# Patient Record
Sex: Female | Born: 1945
Health system: Southern US, Community
[De-identification: ages and names within clinical notes are randomized; demographics above are authoritative.]

## PROBLEM LIST (undated history)

## (undated) DIAGNOSIS — C801 Malignant (primary) neoplasm, unspecified: Secondary | ICD-10-CM

## (undated) DIAGNOSIS — I252 Old myocardial infarction: Secondary | ICD-10-CM

## (undated) DIAGNOSIS — E039 Hypothyroidism, unspecified: Secondary | ICD-10-CM

## (undated) DIAGNOSIS — I519 Heart disease, unspecified: Secondary | ICD-10-CM

## (undated) DIAGNOSIS — M419 Scoliosis, unspecified: Secondary | ICD-10-CM

## (undated) DIAGNOSIS — F329 Major depressive disorder, single episode, unspecified: Secondary | ICD-10-CM

## (undated) DIAGNOSIS — M1990 Unspecified osteoarthritis, unspecified site: Secondary | ICD-10-CM

## (undated) DIAGNOSIS — D510 Vitamin B12 deficiency anemia due to intrinsic factor deficiency: Secondary | ICD-10-CM

## (undated) DIAGNOSIS — K5900 Constipation, unspecified: Secondary | ICD-10-CM

## (undated) DIAGNOSIS — K219 Gastro-esophageal reflux disease without esophagitis: Secondary | ICD-10-CM

## (undated) DIAGNOSIS — K259 Gastric ulcer, unspecified as acute or chronic, without hemorrhage or perforation: Secondary | ICD-10-CM

## (undated) DIAGNOSIS — J45998 Other asthma: Secondary | ICD-10-CM

## (undated) DIAGNOSIS — I351 Nonrheumatic aortic (valve) insufficiency: Secondary | ICD-10-CM

## (undated) DIAGNOSIS — F32A Depression, unspecified: Secondary | ICD-10-CM

## (undated) DIAGNOSIS — R1010 Upper abdominal pain, unspecified: Secondary | ICD-10-CM

## (undated) DIAGNOSIS — E785 Hyperlipidemia, unspecified: Secondary | ICD-10-CM

## (undated) DIAGNOSIS — I10 Essential (primary) hypertension: Secondary | ICD-10-CM

## (undated) HISTORY — DX: Heart disease, unspecified: I51.9

## (undated) HISTORY — DX: Scoliosis, unspecified: M41.9

## (undated) HISTORY — DX: Vitamin B12 deficiency anemia due to intrinsic factor deficiency: D51.0

## (undated) HISTORY — DX: Gastro-esophageal reflux disease without esophagitis: K21.9

## (undated) HISTORY — PX: HYSTERECTOMY ABDOMINAL WITH SALPINGECTOMY: SHX6725

## (undated) HISTORY — DX: Major depressive disorder, single episode, unspecified: F32.9

## (undated) HISTORY — DX: Essential (primary) hypertension: I10

## (undated) HISTORY — DX: Unspecified osteoarthritis, unspecified site: M19.90

## (undated) HISTORY — DX: Malignant (primary) neoplasm, unspecified: C80.1

## (undated) HISTORY — DX: Other asthma: J45.998

## (undated) HISTORY — DX: Gastric ulcer, unspecified as acute or chronic, without hemorrhage or perforation: K25.9

## (undated) HISTORY — DX: Hyperlipidemia, unspecified: E78.5

## (undated) HISTORY — PX: SKIN CANCER EXCISION: SHX779

## (undated) HISTORY — DX: Upper abdominal pain, unspecified: R10.10

## (undated) HISTORY — DX: Depression, unspecified: F32.A

## (undated) HISTORY — DX: Old myocardial infarction: I25.2

## (undated) HISTORY — DX: Hypothyroidism, unspecified: E03.9

## (undated) HISTORY — PX: OTHER SURGICAL HISTORY: SHX169

## (undated) HISTORY — DX: Nonrheumatic aortic (valve) insufficiency: I35.1

## (undated) HISTORY — DX: Constipation, unspecified: K59.00

---
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
## 1968-09-20 HISTORY — PX: INGUINAL HERNIA REPAIR: SUR1180

## 1972-09-20 HISTORY — PX: CHOLECYSTECTOMY: SHX55

## 1974-09-20 HISTORY — PX: TOTAL ABDOMINAL HYSTERECTOMY: SHX209

## 2003-09-21 HISTORY — PX: OTHER SURGICAL HISTORY: SHX169

## 2007-08-14 ENCOUNTER — Ambulatory Visit: Payer: Self-pay | Admitting: Family Medicine

## 2007-08-14 ENCOUNTER — Encounter: Admission: RE | Admit: 2007-08-14 | Discharge: 2007-08-14 | Payer: Self-pay | Admitting: Family Medicine

## 2007-08-14 DIAGNOSIS — I10 Essential (primary) hypertension: Secondary | ICD-10-CM

## 2007-08-14 DIAGNOSIS — E785 Hyperlipidemia, unspecified: Secondary | ICD-10-CM

## 2007-08-14 DIAGNOSIS — E039 Hypothyroidism, unspecified: Secondary | ICD-10-CM | POA: Insufficient documentation

## 2007-08-14 LAB — CONVERTED CEMR LAB
Blood in Urine, dipstick: NEGATIVE
Glucose, Urine, Semiquant: NEGATIVE
Nitrite: NEGATIVE
Protein, U semiquant: NEGATIVE
Specific Gravity, Urine: 1.015

## 2007-08-15 ENCOUNTER — Encounter: Payer: Self-pay | Admitting: Family Medicine

## 2007-08-15 LAB — CONVERTED CEMR LAB
Basophils Absolute: 0 10*3/uL (ref 0.0–0.1)
Basophils Relative: 1 % (ref 0–1)
Eosinophils Absolute: 0.2 10*3/uL (ref 0.2–0.7)
MCHC: 32.3 g/dL (ref 30.0–36.0)
MCV: 91.7 fL (ref 78.0–100.0)
Monocytes Relative: 7 % (ref 3–12)
Neutro Abs: 3.9 10*3/uL (ref 1.7–7.7)
Neutrophils Relative %: 54 % (ref 43–77)
Platelets: 259 10*3/uL (ref 150–400)
RBC: 4.59 M/uL (ref 3.87–5.11)
Sed Rate: 9 mm/hr (ref 0–22)
TSH: 3.858 microintl units/mL (ref 0.350–5.50)

## 2007-08-22 ENCOUNTER — Encounter: Payer: Self-pay | Admitting: Family Medicine

## 2007-08-28 ENCOUNTER — Ambulatory Visit: Payer: Self-pay | Admitting: Family Medicine

## 2007-08-28 DIAGNOSIS — M546 Pain in thoracic spine: Secondary | ICD-10-CM

## 2007-08-28 DIAGNOSIS — K5900 Constipation, unspecified: Secondary | ICD-10-CM | POA: Insufficient documentation

## 2007-09-07 ENCOUNTER — Telehealth (INDEPENDENT_AMBULATORY_CARE_PROVIDER_SITE_OTHER): Payer: Self-pay | Admitting: *Deleted

## 2007-09-22 ENCOUNTER — Telehealth: Payer: Self-pay | Admitting: Family Medicine

## 2007-09-28 ENCOUNTER — Telehealth: Payer: Self-pay | Admitting: Family Medicine

## 2007-10-13 ENCOUNTER — Encounter: Payer: Self-pay | Admitting: Family Medicine

## 2007-10-16 ENCOUNTER — Encounter: Payer: Self-pay | Admitting: Family Medicine

## 2007-10-17 ENCOUNTER — Telehealth: Payer: Self-pay | Admitting: Family Medicine

## 2007-10-17 LAB — CONVERTED CEMR LAB: TSH: 0.065 microintl units/mL — ABNORMAL LOW (ref 0.350–5.50)

## 2007-10-18 ENCOUNTER — Encounter: Admission: RE | Admit: 2007-10-18 | Discharge: 2007-10-18 | Payer: Self-pay | Admitting: Family Medicine

## 2007-10-18 ENCOUNTER — Ambulatory Visit: Payer: Self-pay | Admitting: Family Medicine

## 2007-10-18 DIAGNOSIS — C649 Malignant neoplasm of unspecified kidney, except renal pelvis: Secondary | ICD-10-CM | POA: Insufficient documentation

## 2007-10-19 LAB — CONVERTED CEMR LAB
AST: 17 units/L (ref 0–37)
Albumin: 4.1 g/dL (ref 3.5–5.2)
Alkaline Phosphatase: 53 units/L (ref 39–117)
Basophils Absolute: 0 10*3/uL (ref 0.0–0.1)
Basophils Relative: 1 % (ref 0–1)
Eosinophils Absolute: 0.2 10*3/uL (ref 0.0–0.7)
Eosinophils Relative: 3 % (ref 0–5)
Glucose, Bld: 102 mg/dL — ABNORMAL HIGH (ref 70–99)
HCT: 40 % (ref 36.0–46.0)
Lymphs Abs: 2 10*3/uL (ref 0.7–4.0)
MCHC: 32.8 g/dL (ref 30.0–36.0)
MCV: 90.5 fL (ref 78.0–100.0)
Neutrophils Relative %: 55 % (ref 43–77)
Platelets: 251 10*3/uL (ref 150–400)
Potassium: 4.1 meq/L (ref 3.5–5.3)
RDW: 12.6 % (ref 11.5–15.5)
Sodium: 145 meq/L (ref 135–145)
Total Bilirubin: 0.7 mg/dL (ref 0.3–1.2)
Total Protein: 6.7 g/dL (ref 6.0–8.3)
WBC: 5.9 10*3/uL (ref 4.0–10.5)

## 2007-10-30 ENCOUNTER — Encounter: Admission: RE | Admit: 2007-10-30 | Discharge: 2007-10-30 | Payer: Self-pay | Admitting: Family Medicine

## 2007-11-01 ENCOUNTER — Telehealth: Payer: Self-pay | Admitting: Family Medicine

## 2007-11-02 ENCOUNTER — Encounter: Payer: Self-pay | Admitting: Family Medicine

## 2007-11-03 ENCOUNTER — Ambulatory Visit: Payer: Self-pay | Admitting: Family Medicine

## 2007-11-03 DIAGNOSIS — G44209 Tension-type headache, unspecified, not intractable: Secondary | ICD-10-CM

## 2007-11-30 ENCOUNTER — Telehealth: Payer: Self-pay | Admitting: Family Medicine

## 2008-01-25 ENCOUNTER — Encounter: Payer: Self-pay | Admitting: Family Medicine

## 2008-01-25 ENCOUNTER — Ambulatory Visit: Payer: Self-pay | Admitting: Family Medicine

## 2008-01-25 DIAGNOSIS — M2550 Pain in unspecified joint: Secondary | ICD-10-CM | POA: Insufficient documentation

## 2008-01-25 DIAGNOSIS — F329 Major depressive disorder, single episode, unspecified: Secondary | ICD-10-CM | POA: Insufficient documentation

## 2008-01-25 DIAGNOSIS — D510 Vitamin B12 deficiency anemia due to intrinsic factor deficiency: Secondary | ICD-10-CM

## 2008-01-25 DIAGNOSIS — R290 Tetany: Secondary | ICD-10-CM | POA: Insufficient documentation

## 2008-01-26 LAB — CONVERTED CEMR LAB
BUN: 25 mg/dL — ABNORMAL HIGH (ref 6–23)
CO2: 26 meq/L (ref 19–32)
Calcium: 9.6 mg/dL (ref 8.4–10.5)
Glucose, Bld: 99 mg/dL (ref 70–99)
Sodium: 142 meq/L (ref 135–145)

## 2008-02-02 ENCOUNTER — Telehealth: Payer: Self-pay | Admitting: Family Medicine

## 2008-02-05 ENCOUNTER — Encounter: Payer: Self-pay | Admitting: Family Medicine

## 2008-02-05 DIAGNOSIS — R609 Edema, unspecified: Secondary | ICD-10-CM | POA: Insufficient documentation

## 2008-02-06 ENCOUNTER — Encounter: Payer: Self-pay | Admitting: Family Medicine

## 2008-02-15 ENCOUNTER — Ambulatory Visit: Payer: Self-pay | Admitting: Family Medicine

## 2008-02-15 DIAGNOSIS — M76899 Other specified enthesopathies of unspecified lower limb, excluding foot: Secondary | ICD-10-CM

## 2008-02-19 ENCOUNTER — Telehealth: Payer: Self-pay | Admitting: Family Medicine

## 2008-02-20 ENCOUNTER — Telehealth (INDEPENDENT_AMBULATORY_CARE_PROVIDER_SITE_OTHER): Payer: Self-pay | Admitting: *Deleted

## 2008-02-21 ENCOUNTER — Encounter: Payer: Self-pay | Admitting: Family Medicine

## 2008-02-22 LAB — CONVERTED CEMR LAB: Sed Rate: 17 mm/hr (ref 0–22)

## 2008-02-23 ENCOUNTER — Telehealth: Payer: Self-pay | Admitting: Family Medicine

## 2008-02-23 LAB — CONVERTED CEMR LAB: Rhuematoid fact SerPl-aCnc: <20 intl units/mL (ref 0–20)

## 2008-03-27 ENCOUNTER — Ambulatory Visit: Payer: Self-pay | Admitting: Family Medicine

## 2008-03-27 LAB — CONVERTED CEMR LAB
ALT: 19 units/L (ref 0–35)
Albumin: 4.1 g/dL (ref 3.5–5.2)
Alkaline Phosphatase: 54 units/L (ref 39–117)
Ferritin: 28 ng/mL (ref 10–291)
Folate: >20 ng/mL
Hemoglobin: 12.4 g/dL (ref 12.0–15.0)
LDL Cholesterol: 80 mg/dL (ref 0–99)
Potassium: 4 meq/L (ref 3.5–5.3)
Sodium: 142 meq/L (ref 135–145)
Total Bilirubin: 0.8 mg/dL (ref 0.3–1.2)
Total Protein: 6.6 g/dL (ref 6.0–8.3)
VLDL: 16 mg/dL (ref 0–40)

## 2008-03-28 ENCOUNTER — Encounter: Payer: Self-pay | Admitting: Family Medicine

## 2008-04-04 ENCOUNTER — Telehealth: Payer: Self-pay | Admitting: Family Medicine

## 2008-04-11 ENCOUNTER — Telehealth: Payer: Self-pay | Admitting: Family Medicine

## 2008-05-01 ENCOUNTER — Encounter: Payer: Self-pay | Admitting: Family Medicine

## 2008-05-01 ENCOUNTER — Telehealth: Payer: Self-pay | Admitting: Family Medicine

## 2008-05-01 ENCOUNTER — Ambulatory Visit: Payer: Self-pay | Admitting: Obstetrics & Gynecology

## 2008-05-22 ENCOUNTER — Ambulatory Visit: Payer: Self-pay | Admitting: Family Medicine

## 2008-05-23 ENCOUNTER — Encounter: Payer: Self-pay | Admitting: Family Medicine

## 2008-05-24 LAB — CONVERTED CEMR LAB
Albumin: 4.2 g/dL (ref 3.5–5.2)
Alkaline Phosphatase: 55 units/L (ref 39–117)
CO2: 26 meq/L (ref 19–32)
Calcium: 9.3 mg/dL (ref 8.4–10.5)
Chloride: 106 meq/L (ref 96–112)
Eosinophils Absolute: 0.2 10*3/uL (ref 0.0–0.7)
Glucose, Bld: 84 mg/dL (ref 70–99)
Lymphocytes Relative: 28 % (ref 12–46)
Lymphs Abs: 2.2 10*3/uL (ref 0.7–4.0)
Neutro Abs: 4.9 10*3/uL (ref 1.7–7.7)
Neutrophils Relative %: 62 % (ref 43–77)
Platelets: 223 10*3/uL (ref 150–400)
Potassium: 4.5 meq/L (ref 3.5–5.3)
Sodium: 143 meq/L (ref 135–145)
Total Protein: 6.8 g/dL (ref 6.0–8.3)
Vitamin B-12: 614 pg/mL (ref 211–911)
WBC: 7.9 10*3/uL (ref 4.0–10.5)

## 2008-05-28 ENCOUNTER — Telehealth (INDEPENDENT_AMBULATORY_CARE_PROVIDER_SITE_OTHER): Payer: Self-pay | Admitting: *Deleted

## 2008-06-12 ENCOUNTER — Encounter: Payer: Self-pay | Admitting: Family Medicine

## 2008-06-19 ENCOUNTER — Ambulatory Visit: Payer: Self-pay | Admitting: Family Medicine

## 2008-06-19 DIAGNOSIS — G43009 Migraine without aura, not intractable, without status migrainosus: Secondary | ICD-10-CM | POA: Insufficient documentation

## 2008-06-20 ENCOUNTER — Telehealth (INDEPENDENT_AMBULATORY_CARE_PROVIDER_SITE_OTHER): Payer: Self-pay | Admitting: *Deleted

## 2008-06-20 LAB — CONVERTED CEMR LAB
BUN: 25 mg/dL — ABNORMAL HIGH (ref 6–23)
Glucose, Bld: 83 mg/dL (ref 70–99)
Potassium: 3.6 meq/L (ref 3.5–5.3)

## 2008-06-24 ENCOUNTER — Telehealth: Payer: Self-pay | Admitting: Family Medicine

## 2008-06-28 ENCOUNTER — Ambulatory Visit: Payer: Self-pay | Admitting: Family Medicine

## 2008-06-28 LAB — CONVERTED CEMR LAB
Glucose, Urine, Semiquant: NEGATIVE
Protein, U semiquant: NEGATIVE
Urobilinogen, UA: NEGATIVE
WBC Urine, dipstick: NEGATIVE
pH: 6

## 2008-07-11 ENCOUNTER — Encounter: Payer: Self-pay | Admitting: Family Medicine

## 2008-07-11 ENCOUNTER — Ambulatory Visit: Admission: RE | Admit: 2008-07-11 | Discharge: 2008-07-11 | Payer: Self-pay | Admitting: Family Medicine

## 2008-07-15 ENCOUNTER — Telehealth: Payer: Self-pay | Admitting: Family Medicine

## 2008-07-15 DIAGNOSIS — I38 Endocarditis, valve unspecified: Secondary | ICD-10-CM | POA: Insufficient documentation

## 2008-07-17 ENCOUNTER — Encounter: Admission: RE | Admit: 2008-07-17 | Discharge: 2008-07-17 | Payer: Self-pay | Admitting: Cardiology

## 2008-07-17 ENCOUNTER — Ambulatory Visit: Payer: Self-pay | Admitting: Cardiology

## 2008-08-09 ENCOUNTER — Ambulatory Visit: Payer: Self-pay | Admitting: Family Medicine

## 2008-08-09 DIAGNOSIS — E559 Vitamin D deficiency, unspecified: Secondary | ICD-10-CM | POA: Insufficient documentation

## 2008-08-13 LAB — CONVERTED CEMR LAB
BUN: 24 mg/dL — ABNORMAL HIGH
CO2: 24 meq/L
Calcium: 9.2 mg/dL
Chloride: 109 meq/L
Creatinine, Ser: 1.12 mg/dL
Glucose, Bld: 111 mg/dL — ABNORMAL HIGH
Potassium: 4 meq/L
Sodium: 143 meq/L
Vit D, 1,25-Dihydroxy: 41

## 2008-08-14 ENCOUNTER — Encounter: Payer: Self-pay | Admitting: Family Medicine

## 2008-08-21 ENCOUNTER — Ambulatory Visit: Payer: Self-pay | Admitting: Family Medicine

## 2008-08-21 ENCOUNTER — Encounter: Payer: Self-pay | Admitting: Emergency Medicine

## 2008-08-21 ENCOUNTER — Ambulatory Visit: Payer: Self-pay | Admitting: Diagnostic Radiology

## 2008-08-21 ENCOUNTER — Ambulatory Visit: Payer: Self-pay | Admitting: Internal Medicine

## 2008-08-21 ENCOUNTER — Inpatient Hospital Stay (HOSPITAL_COMMUNITY): Admission: AD | Admit: 2008-08-21 | Discharge: 2008-08-22 | Payer: Self-pay | Admitting: Internal Medicine

## 2008-08-26 ENCOUNTER — Ambulatory Visit: Payer: Self-pay

## 2008-08-26 ENCOUNTER — Encounter: Payer: Self-pay | Admitting: Family Medicine

## 2008-09-05 ENCOUNTER — Ambulatory Visit: Payer: Self-pay | Admitting: Cardiology

## 2008-09-05 ENCOUNTER — Telehealth: Payer: Self-pay | Admitting: Family Medicine

## 2008-09-19 ENCOUNTER — Ambulatory Visit: Payer: Self-pay | Admitting: Family Medicine

## 2008-09-26 ENCOUNTER — Encounter: Payer: Self-pay | Admitting: Family Medicine

## 2008-10-11 ENCOUNTER — Ambulatory Visit: Payer: Self-pay | Admitting: Family Medicine

## 2008-11-04 ENCOUNTER — Encounter: Admission: RE | Admit: 2008-11-04 | Discharge: 2008-11-04 | Payer: Self-pay | Admitting: Family Medicine

## 2008-11-13 ENCOUNTER — Telehealth: Payer: Self-pay | Admitting: Family Medicine

## 2008-11-15 ENCOUNTER — Encounter: Admission: RE | Admit: 2008-11-15 | Discharge: 2008-11-15 | Payer: Self-pay | Admitting: Family Medicine

## 2008-11-15 ENCOUNTER — Ambulatory Visit: Payer: Self-pay | Admitting: Family Medicine

## 2008-12-26 ENCOUNTER — Telehealth: Payer: Self-pay | Admitting: Family Medicine

## 2008-12-26 ENCOUNTER — Encounter: Payer: Self-pay | Admitting: Family Medicine

## 2009-01-08 ENCOUNTER — Telehealth: Payer: Self-pay | Admitting: Family Medicine

## 2009-01-10 ENCOUNTER — Encounter: Payer: Self-pay | Admitting: Family Medicine

## 2009-01-14 LAB — CONVERTED CEMR LAB
Basophils Relative: 1 % (ref 0–1)
Eosinophils Absolute: 0.2 10*3/uL (ref 0.0–0.7)
MCHC: 33.3 g/dL (ref 30.0–36.0)
MCV: 93.4 fL (ref 78.0–100.0)
Neutrophils Relative %: 61 % (ref 43–77)
Platelets: 189 10*3/uL (ref 150–400)

## 2009-01-20 ENCOUNTER — Ambulatory Visit: Payer: Self-pay | Admitting: Family Medicine

## 2009-01-22 ENCOUNTER — Telehealth (INDEPENDENT_AMBULATORY_CARE_PROVIDER_SITE_OTHER): Payer: Self-pay | Admitting: *Deleted

## 2009-02-19 ENCOUNTER — Ambulatory Visit: Payer: Self-pay | Admitting: Cardiology

## 2009-02-19 ENCOUNTER — Encounter: Payer: Self-pay | Admitting: Family Medicine

## 2009-02-19 DIAGNOSIS — M4120 Other idiopathic scoliosis, site unspecified: Secondary | ICD-10-CM | POA: Insufficient documentation

## 2009-02-19 DIAGNOSIS — I719 Aortic aneurysm of unspecified site, without rupture: Secondary | ICD-10-CM | POA: Insufficient documentation

## 2009-02-19 DIAGNOSIS — I359 Nonrheumatic aortic valve disorder, unspecified: Secondary | ICD-10-CM | POA: Insufficient documentation

## 2009-03-05 ENCOUNTER — Telehealth: Payer: Self-pay | Admitting: Family Medicine

## 2009-03-19 ENCOUNTER — Telehealth: Payer: Self-pay | Admitting: Family Medicine

## 2009-04-07 ENCOUNTER — Telehealth: Payer: Self-pay | Admitting: Family Medicine

## 2009-04-11 ENCOUNTER — Ambulatory Visit: Payer: Self-pay | Admitting: Family Medicine

## 2009-04-11 DIAGNOSIS — Z780 Asymptomatic menopausal state: Secondary | ICD-10-CM | POA: Insufficient documentation

## 2009-04-15 LAB — CONVERTED CEMR LAB
ALT: 21 units/L (ref 0–35)
CO2: 26 meq/L (ref 19–32)
Cholesterol: 107 mg/dL (ref 0–200)
Creatinine, Ser: 0.98 mg/dL (ref 0.40–1.20)
HDL: 39 mg/dL — ABNORMAL LOW (ref >39)
Total Bilirubin: 0.8 mg/dL (ref 0.3–1.2)
Total CHOL/HDL Ratio: 2.7
VLDL: 13 mg/dL (ref 0–40)

## 2009-04-16 ENCOUNTER — Telehealth: Payer: Self-pay | Admitting: Family Medicine

## 2009-04-17 ENCOUNTER — Encounter: Admission: RE | Admit: 2009-04-17 | Discharge: 2009-04-17 | Payer: Self-pay | Admitting: Family Medicine

## 2009-04-17 ENCOUNTER — Encounter: Payer: Self-pay | Admitting: Family Medicine

## 2009-05-08 ENCOUNTER — Telehealth: Payer: Self-pay | Admitting: Family Medicine

## 2009-05-13 ENCOUNTER — Encounter: Payer: Self-pay | Admitting: Family Medicine

## 2009-05-14 ENCOUNTER — Telehealth: Payer: Self-pay | Admitting: Family Medicine

## 2009-05-21 ENCOUNTER — Encounter: Payer: Self-pay | Admitting: Family Medicine

## 2009-05-28 ENCOUNTER — Encounter: Payer: Self-pay | Admitting: Family Medicine

## 2009-06-12 ENCOUNTER — Encounter (INDEPENDENT_AMBULATORY_CARE_PROVIDER_SITE_OTHER): Payer: Self-pay | Admitting: *Deleted

## 2009-06-23 ENCOUNTER — Encounter: Payer: Self-pay | Admitting: Family Medicine

## 2009-06-26 ENCOUNTER — Ambulatory Visit: Payer: Self-pay | Admitting: Family Medicine

## 2009-06-27 ENCOUNTER — Telehealth (INDEPENDENT_AMBULATORY_CARE_PROVIDER_SITE_OTHER): Payer: Self-pay | Admitting: *Deleted

## 2009-07-10 ENCOUNTER — Ambulatory Visit: Payer: Self-pay | Admitting: Family Medicine

## 2009-07-10 DIAGNOSIS — N952 Postmenopausal atrophic vaginitis: Secondary | ICD-10-CM

## 2009-07-11 LAB — CONVERTED CEMR LAB
Iron: 120 ug/dL (ref 42–145)
MCHC: 32.4 g/dL (ref 30.0–36.0)
Platelets: 237 10*3/uL (ref 150–400)
RBC: 4.42 M/uL (ref 3.87–5.11)
RDW: 12.8 % (ref 11.5–15.5)
TSH: 4.49 microintl units/mL (ref 0.350–4.500)

## 2009-08-11 ENCOUNTER — Ambulatory Visit: Payer: Self-pay | Admitting: Family Medicine

## 2009-08-18 ENCOUNTER — Ambulatory Visit: Payer: Self-pay | Admitting: Family Medicine

## 2009-08-25 ENCOUNTER — Ambulatory Visit: Payer: Self-pay | Admitting: Cardiology

## 2009-09-01 ENCOUNTER — Encounter: Payer: Self-pay | Admitting: Cardiology

## 2009-09-01 ENCOUNTER — Ambulatory Visit (HOSPITAL_COMMUNITY): Admission: RE | Admit: 2009-09-01 | Discharge: 2009-09-01 | Payer: Self-pay | Admitting: Cardiology

## 2009-09-08 ENCOUNTER — Ambulatory Visit: Payer: Self-pay

## 2009-09-08 ENCOUNTER — Encounter: Payer: Self-pay | Admitting: Cardiology

## 2009-09-08 ENCOUNTER — Ambulatory Visit (HOSPITAL_COMMUNITY): Admission: RE | Admit: 2009-09-08 | Discharge: 2009-09-08 | Payer: Self-pay | Admitting: Cardiology

## 2009-09-08 ENCOUNTER — Ambulatory Visit: Payer: Self-pay | Admitting: Internal Medicine

## 2009-09-17 ENCOUNTER — Encounter: Payer: Self-pay | Admitting: Family Medicine

## 2009-09-17 LAB — CONVERTED CEMR LAB
BUN: 19 mg/dL (ref 6–23)
Basophils Absolute: 0.1 10*3/uL (ref 0.0–0.1)
Basophils Relative: 1 % (ref 0–1)
CO2: 28 meq/L (ref 19–32)
Calcium: 9.2 mg/dL (ref 8.4–10.5)
Eosinophils Relative: 4 % (ref 0–5)
Glucose, Bld: 87 mg/dL (ref 70–99)
HCT: 43 % (ref 36.0–46.0)
Hemoglobin: 14 g/dL (ref 12.0–15.0)
MCHC: 32.6 g/dL (ref 30.0–36.0)
Monocytes Absolute: 0.5 10*3/uL (ref 0.1–1.0)
Monocytes Relative: 8 % (ref 3–12)
Potassium: 4.2 meq/L (ref 3.5–5.3)
RBC: 4.68 M/uL (ref 3.87–5.11)
RDW: 13.3 % (ref 11.5–15.5)
TSH: 2.957 microintl units/mL (ref 0.350–4.500)
Vit D, 25-Hydroxy: 54 ng/mL (ref 30–89)

## 2009-09-18 ENCOUNTER — Telehealth: Payer: Self-pay | Admitting: Family Medicine

## 2009-09-22 ENCOUNTER — Ambulatory Visit: Payer: Self-pay | Admitting: Family Medicine

## 2009-10-01 ENCOUNTER — Ambulatory Visit: Payer: Self-pay | Admitting: Family Medicine

## 2009-11-10 ENCOUNTER — Encounter: Admission: RE | Admit: 2009-11-10 | Discharge: 2009-11-10 | Payer: Self-pay | Admitting: Family Medicine

## 2009-11-14 ENCOUNTER — Ambulatory Visit: Payer: Self-pay | Admitting: Family Medicine

## 2009-11-28 ENCOUNTER — Ambulatory Visit: Payer: Self-pay | Admitting: Family Medicine

## 2009-12-03 LAB — CONVERTED CEMR LAB
T3, Free: 2.6 pg/mL (ref 2.3–4.2)
TSH: 1.36 microintl units/mL (ref 0.350–4.500)

## 2009-12-05 ENCOUNTER — Telehealth: Payer: Self-pay | Admitting: Family Medicine

## 2009-12-12 ENCOUNTER — Ambulatory Visit: Payer: Self-pay | Admitting: Family Medicine

## 2010-01-08 ENCOUNTER — Ambulatory Visit: Payer: Self-pay | Admitting: Family Medicine

## 2010-01-11 IMAGING — CT CT ANGIO ABDOMEN
2 of 7 series · 14 of 46 positions shown, 18 images · IV contrast (APPLIED)
Comparison: CTA chest 07/17/2008

CTA CHEST

CLINICAL DATA: Chest pain and syncopal episode.  History of
aneurysmal disease of the ascending thoracic aorta.

CT ANGIOGRAPHY CHEST, ABDOMEN AND PELVIS
TECHNIQUE: Multidetector CT imaging through the chest, abdomen and
pelvis was performed using the standard protocol during bolus
administration of intravenous contrast. Multiplanar reconstructed
images including MIPs were obtained and reviewed to evaluate the
vascular anatomy.
Contrast: 100 ml Omnipaque 350 IV

[Series 4: dissection 2.0 st · axial · 0.67mm/px · z∈[-800,-280]mm · 11 of 296 slices shown, 15 images]
[im 18/296  soft-tissue]
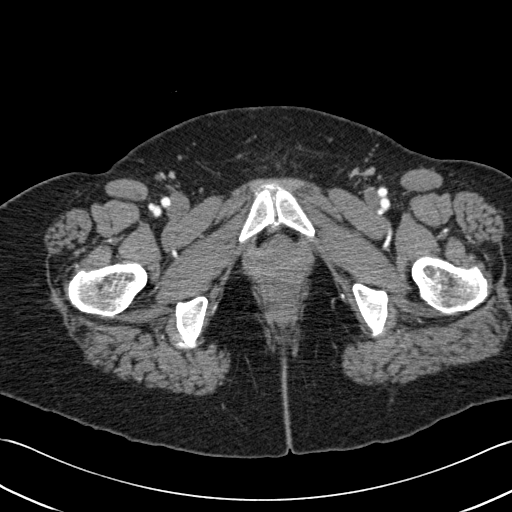
[im 18/296  bone]
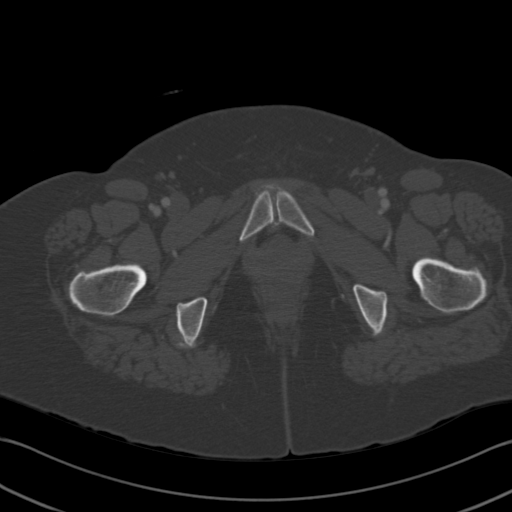
[im 53/296  soft-tissue]
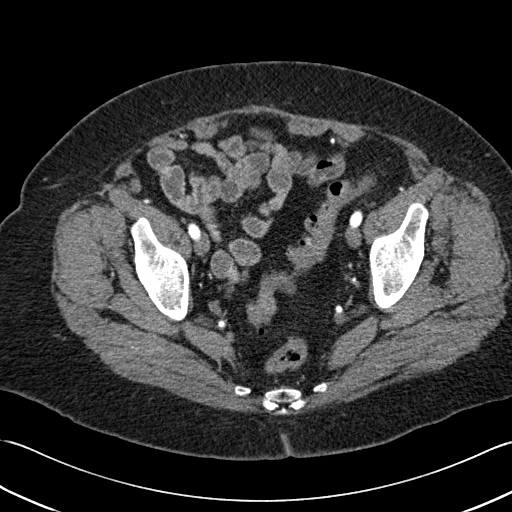
[im 87/296  soft-tissue]
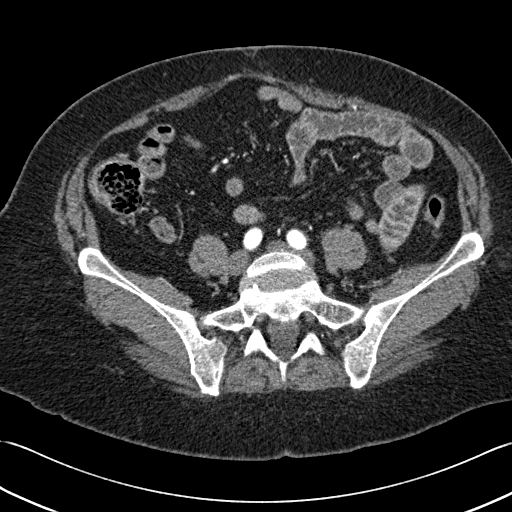
[im 122/296  soft-tissue]
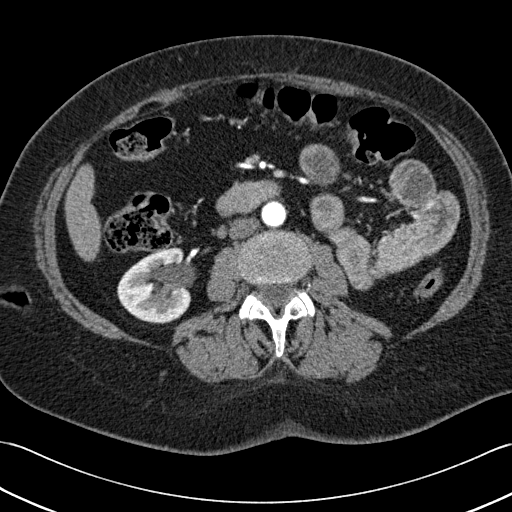
[im 157/296  soft-tissue]
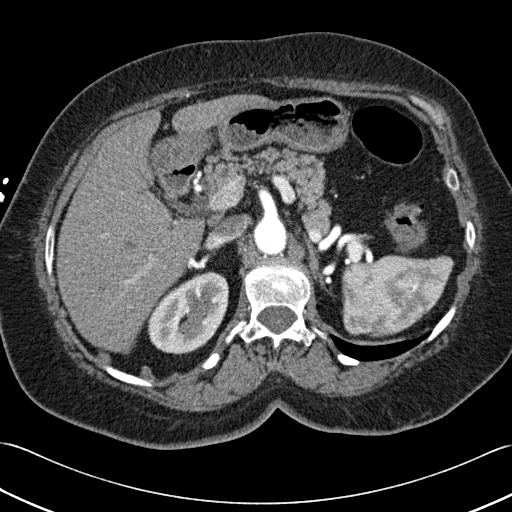
[im 174/296  soft-tissue]
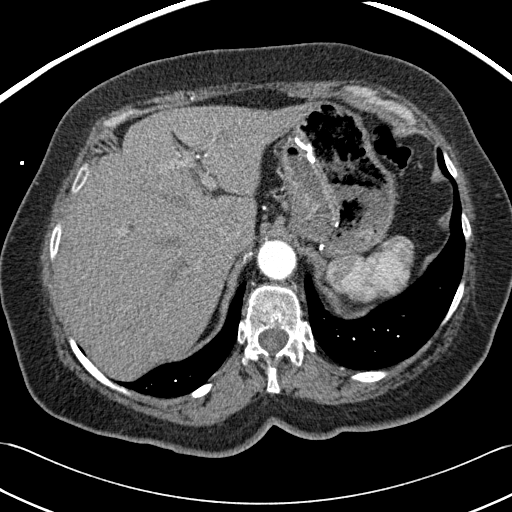
[im 209/296  soft-tissue]
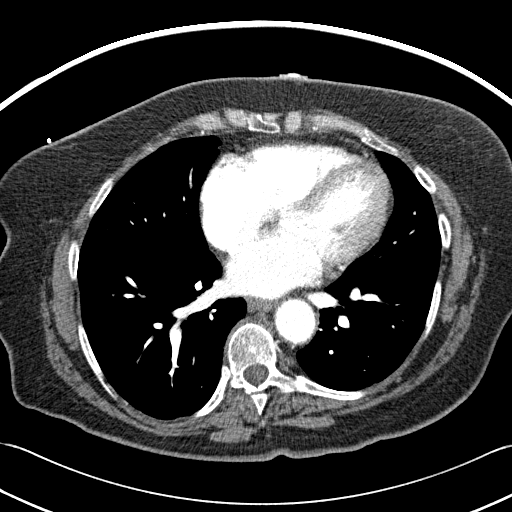
[im 226/296  lung]
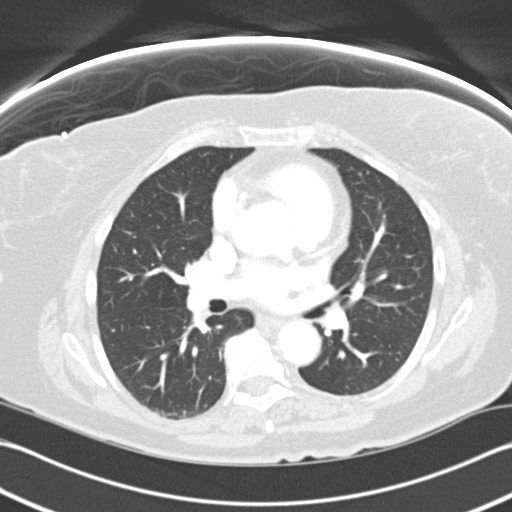
[im 243/296  soft-tissue]
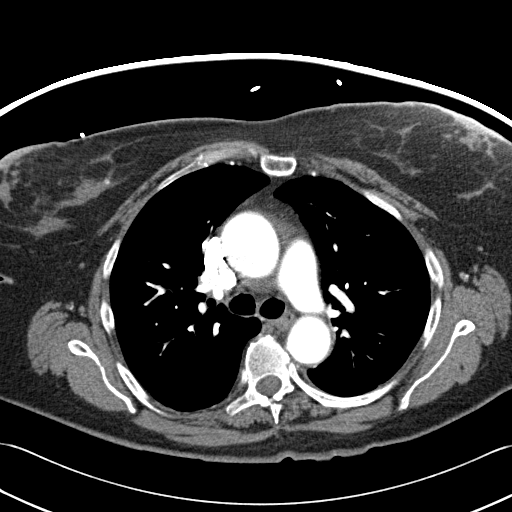
[im 243/296  lung]
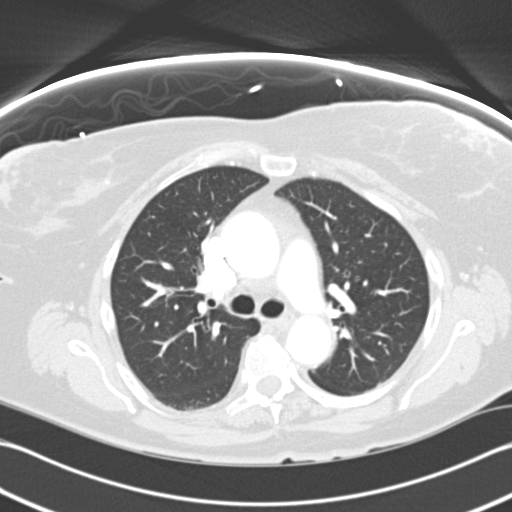
[im 261/296  lung]
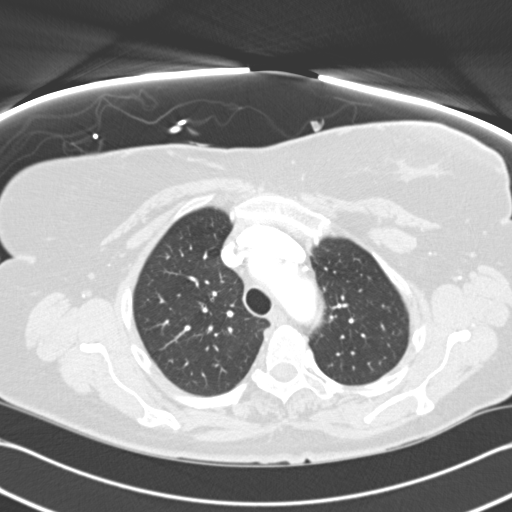
[im 278/296  soft-tissue]
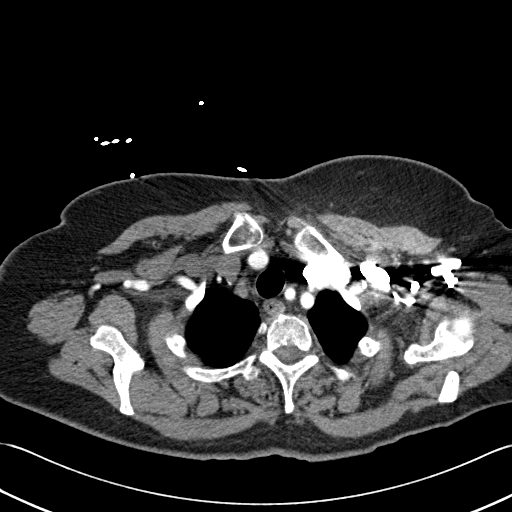
[im 278/296  lung]
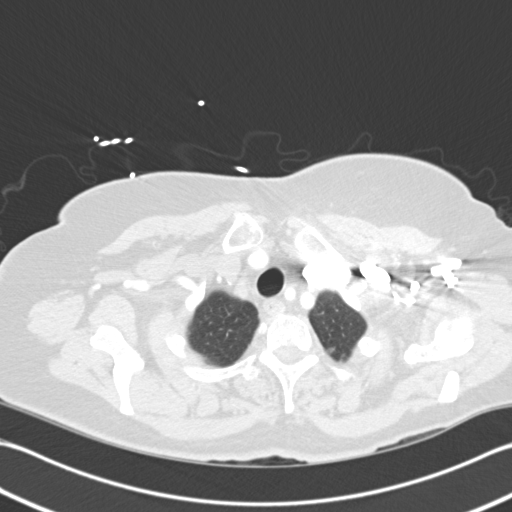
[im 278/296  bone]
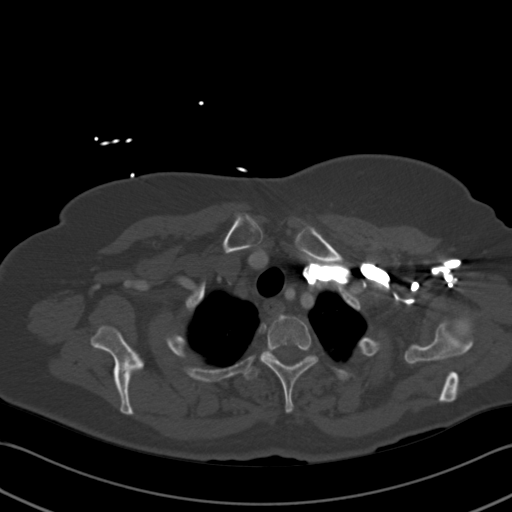

[Series 604: coronal mips · coronal · 1.15mm/px · 3 of 136 slices shown]
[im 28/136  soft-tissue]
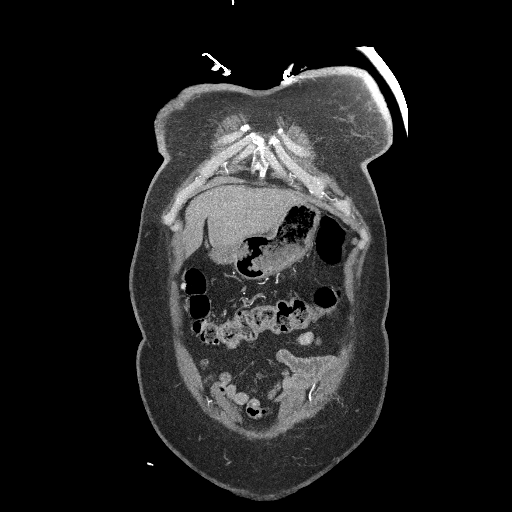
[im 55/136  soft-tissue]
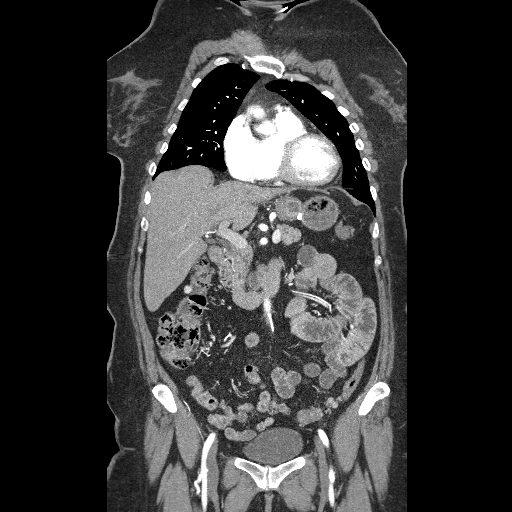
[im 82/136  soft-tissue]
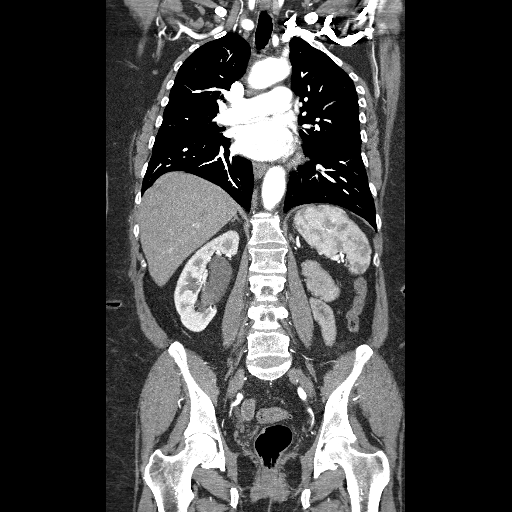

[14 of 46 positions shown; findings below may reference images not displayed]

FINDINGS: There is stable minimal aneurysmal dilatation of the
ascending thoracic aorta which measures approximately 4 cm in
greatest diameter.  This dilatation is located distal to the aortic
root and sinuses of Valsalva.

There is no evidence of thoracic aortic dissection or mediastinal
hemorrhage.  No pericardial or pleural effusion is present.  The
pulmonary arteries are also well opacified on the study and acute
pulmonary embolism is able to be excluded.

No pulmonary infiltrates or nodules.  No enlarged lymph nodes.
Bony structures are unremarkable.
IMPRESSION: Stable mild aneurysmal dilatation of the ascending thoracic aorta
with no evidence of acute dissection or hemorrhage.  The pulmonary
arteries are also well assessed on this study and demonstrate no
evidence of pulmonary embolism.

CTA ABDOMEN
FINDINGS: The abdominal aorta is of normal caliber and
demonstrates no evidence of aneurysmal disease, stenosis or
dissection.  The patient is status post prior left nephrectomy.  A
solitary right kidney shows widely patent arterial supplied by a
dominant renal artery as well as an accessory lower pole artery.
Mesenteric arteries are normally patent including well visualized
separate origin of the left gastric artery off of the abdominal
aorta, celiac axis, superior mesenteric artery and inferior
mesenteric artery.

The patient is status post cholecystectomy.  No biliary dilatation
is identified.  Clips are also present related to prior gastric
surgery.  At the arterial phase of imaging no other incidental
findings are identified.
IMPRESSION: Normal CTA of abdomen demonstrating no evidence of aortic
pathology.  A solitary right kidney is supplied by two widely
patent renal arteries.

CTA PELVIS
FINDINGS: Bilateral iliac arteries and common femoral arteries show
normal patency.  Femoral bifurcations are also visualized
bilaterally and show widely patent proximal profunda femoral and
superficial femoral arteries.  No evidence of hemorrhage or free
fluid.  The bladder is unremarkable.  No incidental masses are
identified.  No abnormal calcifications.  No hernias.
IMPRESSION: Normal CTA of pelvis.

## 2010-01-14 ENCOUNTER — Telehealth: Payer: Self-pay | Admitting: Family Medicine

## 2010-01-19 ENCOUNTER — Telehealth (INDEPENDENT_AMBULATORY_CARE_PROVIDER_SITE_OTHER): Payer: Self-pay | Admitting: *Deleted

## 2010-08-17 ENCOUNTER — Telehealth (INDEPENDENT_AMBULATORY_CARE_PROVIDER_SITE_OTHER): Payer: Self-pay | Admitting: *Deleted

## 2010-08-19 ENCOUNTER — Ambulatory Visit: Payer: Self-pay | Admitting: Cardiology

## 2010-08-19 ENCOUNTER — Encounter: Payer: Self-pay | Admitting: Cardiology

## 2010-08-19 DIAGNOSIS — R079 Chest pain, unspecified: Secondary | ICD-10-CM

## 2010-08-20 LAB — CONVERTED CEMR LAB
Calcium: 9.1 mg/dL (ref 8.4–10.5)
GFR calc non Af Amer: 73.58 mL/min (ref >60)
Glucose, Bld: 89 mg/dL (ref 70–99)
Potassium: 4.5 meq/L (ref 3.5–5.1)
Sodium: 143 meq/L (ref 135–145)

## 2010-08-24 ENCOUNTER — Encounter: Admission: RE | Admit: 2010-08-24 | Discharge: 2010-08-24 | Payer: Self-pay | Admitting: Cardiology

## 2010-10-12 ENCOUNTER — Other Ambulatory Visit: Payer: Self-pay | Admitting: Internal Medicine

## 2010-10-12 DIAGNOSIS — Z1231 Encounter for screening mammogram for malignant neoplasm of breast: Secondary | ICD-10-CM

## 2010-10-12 DIAGNOSIS — Z1239 Encounter for other screening for malignant neoplasm of breast: Secondary | ICD-10-CM

## 2010-10-18 LAB — CONVERTED CEMR LAB
BUN: 18 mg/dL (ref 6–23)
Calcium: 9.1 mg/dL (ref 8.4–10.5)
Creatinine, Ser: 1 mg/dL (ref 0.4–1.2)
GFR calc non Af Amer: 59.53 mL/min (ref >60)
Glucose, Bld: 93 mg/dL (ref 70–99)

## 2010-10-22 NOTE — Assessment & Plan Note (Signed)
Summary: yearly/sl/pt having chest pain/lg   Primary Provider:  Seymour Bars DO  CC:  chest pain..pt states its related to stress.  History of Present Illness: [name] is a pleasant  female, who I have followed for dilated aortic root and aortic insufficiency.  She had a followup CT on August 22, 2008.  There is mild aneurysmal dilatation of the ascending thoracic aorta, but no dissection or hemorrhage was noted.  There is no pulmonary embolus.  She had normal CT of her abdomen with no evidence of aortic pathology.  There was a solitary right kidney supplied by 2 widely patent renal arteries.  She did have a Myoview as an outpatient, which was performed on August 26, 2008.  This showed mild apical thinning, but no ischemia or infarction.  Her ejection fraction is 74%.  A CardioNet monitor for palpitations in December of 2009 showed sinus to sinus tachycardia with short run of PAT in a rare PVC. An echocardiogram in Dec 2010 showed normal LV function, mild  aortic insufficiency and mild mitral regurgitation. MRA of the aortic root in Dec 2010 showed a stable dilatation at 4 cm. I last saw her in Dec of 2010. Since then she has had occasional chest pain. It is in the left breast area. It occurred suddenly and is described as a pin sensation. There is a dull discomfort in her left arm. It lasts seconds and resolve spontaneously. There is no associated symptoms. It is not exertional. She otherwise does not have dyspnea on exertion, orthopnea, PND, pedal edema, palpitations, syncope or exertional chest pain.  Current Medications (verified): 1)  Aspirin 81 Mg  Tabs (Aspirin) .Marland Kitchen.. 1 Tab By Mouth Once Daily 2)  Ramipril 5 Mg Caps (Ramipril) .Marland Kitchen.. 1 Tab By Mouth Daily 3)  Armour Thyroid 90 Mg Tabs (Thyroid) .Marland Kitchen.. 1 Tab By Mouth Once Daily  Allergies: 1)  ! Percocet  Past History:  Past Medical History: Reviewed history from 08/25/2009 and no changes required. scoliosis L Stage I Renal Cell  Carcinoma, 2004 HTN Hyperlipidemia pernicious anemia hypothyroid 2008 G2P2 depression valvular heart dz with dilated aortic root 4.2 cm (echo 10-09) aortic insufficiency  Past Surgical History: Reviewed history from 11/28/2009 and no changes required. tonsil 1950 inguinal hernia 1970 LTCS x 2 cholecystectomy 1974 TAH 1976 arthroscopic knee x 2 L nephrecotmy for stage 1 RCC 2005 stomach stapling bilat cataract surgery ( Dr Hermenia Fiscal)  Social History: Reviewed history from 08/14/2007 and no changes required. 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 2 grown kids in [place] and [place]. Adopted her 41 yo granddaughter. Not sexually active. Does not exercise. 1 glass wine/ day Never smoked.  Review of Systems       no fevers or chills, productive cough, hemoptysis, dysphasia, odynophagia, melena, hematochezia, dysuria, hematuria, rash, seizure activity, orthopnea, PND, pedal edema, claudication. Remaining systems are negative.   Vital Signs:  Patient profile:   65 year old female Menstrual status:  hysterectomy Height:      63 inches Weight:      189 pounds BMI:     33.60 Pulse rate:   56 / minute Resp:     14 per minute BP sitting:   120 / 74  (left arm)  Vitals Entered By: Kem Parkinson (August 19, 2010 2:34 PM)  Physical Exam  General:  Well-developed well-nourished in no acute distress.  Skin is warm and dry.  HEENT is normal.  Neck is supple. No thyromegaly.  Chest is clear to auscultation with normal expansion.  Cardiovascular exam is regular rate and rhythm.  Abdominal exam nontender or distended. No masses palpated. Extremities show no edema. neuro grossly intact    EKG  Procedure date:  08/19/2010  Findings:      Sinus bradycardia with no ST changes.  Impression & Recommendations:  Problem # 1:  VALVULAR HEART DISEASE (ICD-424.90) Plan followup echocardiogram in one year when she  returns.  Problem # 2:  HYPERLIPIDEMIA (ICD-272.4) Shas lost weight and has been taken off of her Lipitor by her primary care. The following medications were removed from the medication list:    Lipitor 40 Mg Tabs (Atorvastatin calcium) .Marland Kitchen... Take 1 tab by mouth at bedtime  The following medications were removed from the medication list:    Lipitor 40 Mg Tabs (Atorvastatin calcium) .Marland Kitchen... Take 1 tab by mouth at bedtime  Problem # 3:  HYPERTENSION, BENIGN ESSENTIAL (ICD-401.1) Blood pressure controlled on present medications. Will continue. Potassium and renal function monitored by primary care. Her updated medication list for this problem includes:    Aspirin 81 Mg Tabs (Aspirin) .Marland Kitchen... 1 tab by mouth once daily    Ramipril 5 Mg Caps (Ramipril) .Marland Kitchen... 1 tab by mouth daily  Problem # 4:  AORTIC ANEURYSM (ICD-441.9) Plan repeat MRA.  Problem # 5:  HYPOTHYROIDISM (ICD-244.9)  Her updated medication list for this problem includes:    Armour Thyroid 90 Mg Tabs (Thyroid) .Marland Kitchen... 1 tab by mouth once daily  Problem # 6:  CHEST PAIN (ICD-786.50) Symptoms atypical and ECG normal. Plan MRA of the thoracic aorta. Her updated medication list for this problem includes:    Aspirin 81 Mg Tabs (Aspirin) .Marland Kitchen... 1 tab by mouth once daily    Ramipril 5 Mg Caps (Ramipril) .Marland Kitchen... 1 tab by mouth daily  Other Orders: MRA (MRA)  Patient Instructions: 1)  Your physician wants you to follow-up in:ONE YEAR   You will receive a reminder letter in the mail two months in advance. If you don't receive a letter, please call our office to schedule the follow-up appointment. 2)  MRA OF THE CHEST TO FOLLOW UP THORACIC AMEURYSM

## 2010-10-22 NOTE — Assessment & Plan Note (Signed)
Summary: CPE w/o pap   Vital Signs:  Patient profile:   65 year old female Menstrual status:  hysterectomy Height:      63 inches Weight:      189 pounds BMI:     33.60 O2 Sat:      98 % on Room air Pulse rate:   64 / minute BP sitting:   131 / 75  (left arm) Cuff size:   regular  Vitals Entered By: Payton Spark CMA (November 28, 2009 8:09 AM)  O2 Flow:  Room air CC: CPE     Menstrual Status hysterectomy   Primary Care Provider:  Seymour Bars DO  CC:  CPE.  History of Present Illness: 65 yo WF presents for CPE w/o pap smear.  She is s/p TAH for non-cancerous reasons and is not sexually active.  She has some vaginal atrophy and did well with vaginal premarin cream.   She has a history of L sided RCC, HTN, hyperlipidemia, pernicious anemia, hypothyroidism, depression, mild aortic and mitral valve regurgitation, Stage I diastolic CHF and a stable dilated Aortic aneurysm, followed by Dr Jens Som.  She had stopped her Lipitor 2 mos ago b/c of someone else having myalgias.  Her labs, mammogram, DEXA are all UTD.  Colonoscopy UTD.  She continues to struggle with weight loss but does NO exercise and works long hours and has a poor diet.  She also has hx of migraines.   Current Medications (verified): 1)  Clonazepam 0.5 Mg  Tabs (Clonazepam) .... Take 1 Tablet By Mouth Once A Day At Bedtime 2)  Aspirin 81 Mg  Tabs (Aspirin) .Marland Kitchen.. 1 Tab By Mouth Once Daily 3)  Thyroid 1.5 Grain .Marland Kitchen.. 1 Tab By Mouth Daily (Eviqualent To 88 Micrograms Synthroid) 4)  Ramipril 5 Mg Caps (Ramipril) .Marland Kitchen.. 1 Tab By Mouth Daily 5)  Citalopram Hydrobromide 20 Mg Tabs (Citalopram Hydrobromide) .Marland Kitchen.. 1 Tab By Mouth Daily 6)  Vitamin D 400 Unit Caps (Cholecalciferol) .Marland Kitchen.. 1 Tab By Mouth Once Daily 7)  Calcium/c/d 500-10-250 Mg-Mg-Unit Chew (Calcium-Vitamins C & D) .Marland Kitchen.. 1 Tab By Mouth Once Daily  Allergies (verified): 1)  ! Percocet  Past History:  Past Medical History: Reviewed history from 08/25/2009 and no  changes required. scoliosis L Stage I Renal Cell Carcinoma, 2004 HTN Hyperlipidemia pernicious anemia hypothyroid 2008 G2P2 depression valvular heart dz with dilated aortic root 4.2 cm (echo 10-09) aortic insufficiency  Past Surgical History: tonsil 1950 inguinal hernia 1970 LTCS x 2 cholecystectomy 1974 TAH 1976 arthroscopic knee x 2 L nephrecotmy for stage 1 RCC 2005 stomach stapling bilat cataract surgery ( Dr Hermenia Fiscal)  Family History: Reviewed history from 08/14/2007 and no changes required. mother alive, breast cancer in 27s, lung, skin and bladder cancer, depression, high col father died, renal cell CA, lung cancer  brother died lung cancer at 65  Social History: Reviewed history from 08/14/2007 and no changes required. 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 2 grown kids in [place] and [place]. Adopted her 85 yo granddaughter. Not sexually active. Does not exercise. 1 glass wine/ day Never smoked.  Review of Systems       The patient complains of weight gain.  The patient denies anorexia, fever, weight loss, vision loss, decreased hearing, hoarseness, chest pain, syncope, dyspnea on exertion, peripheral edema, prolonged cough, headaches, hemoptysis, abdominal pain, melena, hematochezia, severe indigestion/heartburn, hematuria, incontinence, genital sores, muscle weakness, suspicious skin lesions, transient blindness, difficulty walking, depression, unusual weight change,  abnormal bleeding, enlarged lymph nodes, angioedema, breast masses, and testicular masses.    Physical Exam  General:  alert, well-developed, well-nourished, and well-hydrated.  obese Head:  normocephalic and atraumatic.   Eyes:  pupils equal, pupils round, and pupils reactive to light.  s/p bilat cataract surgery Ears:  EACs patent; TMs translucent and gray with good cone of light and bony landmarks.  Nose:  no nasal discharge.  boggy  turbinates Mouth:  good dentition and pharynx pink and moist.   Neck:  no masses.  no audible carotid bruits Breasts:  No mass, nodules, thickening, tenderness, bulging, retraction, inflamation, nipple discharge or skin changes noted.   Lungs:  Normal respiratory effort, chest expands symmetrically. Lungs are clear to auscultation, no crackles or wheezes. Heart:  Normal rate and regular rhythm. S1 and S2 normal without gallop, murmur, click, rub or other extra sounds. Abdomen:  Bowel sounds positive,abdomen soft and non-tender without masses, organomegaly, no AA bruits Pulses:  2+ radial and pedal pulses Extremities:  no UE or LE edema Skin:  small round AK over L ankle.  several SKs on trunk Cervical Nodes:  No lymphadenopathy noted Psych:  good eye contact, not anxious appearing, and depressed affect.     Impression & Recommendations:  Problem # 1:  HEALTH MAINTENANCE EXAM (ICD-V70.0) Keeping healthy checklist for women reviewed. BP at goal.  BMI 33 c/w class I obesity.  She is quite upset about her weight but has no exercise and a poor diet.  Agreed to use Topamax for weight loss and migraine managment. Labs are UTD other than thyroid labs.  Continue b12 shots and recheck level this summer. Restart Lipitor -- has CHF, valvular heart dz and a dilated aortic root. Due to see Dr Jens Som back Dec this year. Mammogram and DEXA and colonoscopy and Tetanus are UTD. RTC in 2 mos.  Complete Medication List: 1)  Clonazepam 0.5 Mg Tabs (Clonazepam) .... Take 1 tablet by mouth once a day at bedtime 2)  Aspirin 81 Mg Tabs (Aspirin) .Marland Kitchen.. 1 tab by mouth once daily 3)  Thyroid 1.5 Grain  .Marland Kitchen.. 1 tab by mouth daily (eviqualent to 88 micrograms synthroid) 4)  Ramipril 5 Mg Caps (Ramipril) .Marland Kitchen.. 1 tab by mouth daily 5)  Citalopram Hydrobromide 20 Mg Tabs (Citalopram hydrobromide) .Marland Kitchen.. 1 tab by mouth daily 6)  Vitamin D 400 Unit Caps (Cholecalciferol) .Marland Kitchen.. 1 tab by mouth once daily 7)  Calcium/c/d  500-10-250 Mg-mg-unit Chew (Calcium-vitamins c & d) .Marland Kitchen.. 1 tab by mouth once daily 8)  Topiramate 50 Mg Tabs (Topiramate) .Marland Kitchen.. 1 tab by mouth at bedtime x 1 wk then 1 tab by mouth bid  Other Orders: T-T4, Free [phone]) T-T3, Free [phone]) T-TSH [phone])  Patient Instructions: 1)  Stay on current meds. 2)  Check Thyroid labs today. 3)  Start Topiramate 1/2 tab at bedtime x 1 wk then 1 tab at bedtime x 1 wk then 1 tab by mouth two times a day for both migraine prevention and appetite suppression. 4)  Return for f/u in 2 mos. Prescriptions: TOPIRAMATE 50 MG TABS (TOPIRAMATE) 1 tab by mouth at bedtime x 1 wk then 1 tab by mouth bid  #60 x 2   Entered and Authorized by:   Seymour Bars DO   Signed by:   Seymour Bars DO on 11/28/2009   Method used:   Electronically to        [facility] # [phone]* (retail)       [address] [place], [place]  [address]       Ph: [phone]       Fax: [phone]   RxID:   [id number]

## 2010-10-22 NOTE — Miscellaneous (Signed)
Summary: Flu/[facility]  Flu/[facility]   Imported By: Lanelle Bal 10/21/2009 14:15:11  _____________________________________________________________________  External Attachment:    Type:   Image     Comment:   External Document

## 2010-10-22 NOTE — Progress Notes (Signed)
Summary: Topamax makes her to sleepy  Phone Note Call from Patient Call back at Home Phone [phone]   Caller: Patient Call For: Brianna Bars DO Summary of Call: Pt states that the Topamax is making her too sleepy. Instructed pt to stop the med and I would send MD info and would address on Monday when MD returns in office Initial call taken by: Kathlene November,  December 05, 2009 1:39 PM  Follow-up for Phone Call        I would like her to try a 1/2 tab at dinner time for the first 2 wks.  Let's see if the sleepiness gets better.  It usually does after the first couple wks.   Follow-up by: Brianna Bars DO,  December 08, 2009 8:03 AM     Appended Document: Topamax makes her to sleepy Pt notified of MD instructions. KJ LPN

## 2010-10-22 NOTE — Assessment & Plan Note (Signed)
Summary: B-12 SHOT  Nurse Visit   Vitals Entered By: Payton Spark CMA (September 22, 2009 8:51 AM)  Allergies: 1)  ! Percocet  Medication Administration  Injection # 1:    Medication: Vit B12 1000 mcg    Diagnosis: ANEMIA, PERNICIOUS (ICD-281.0)    Route: IM    Site: L deltoid    Exp Date: 10/12    Lot #: [id number]    Mfr: American Regent    Patient tolerated injection without complications    Given by: Payton Spark CMA (September 22, 2009 8:52 AM)  Orders Added: 1)  Vit B12 1000 mcg [J3420] 2)  Admin of Therapeutic Inj  intramuscular or subcutaneous [[id number]]   Medication Administration  Injection # 1:    Medication: Vit B12 1000 mcg    Diagnosis: ANEMIA, PERNICIOUS (ICD-281.0)    Route: IM    Site: L deltoid    Exp Date: 10/12    Lot #: [id number]    Mfr: American Regent    Patient tolerated injection without complications    Given by: Payton Spark CMA (September 22, 2009 8:52 AM)  Orders Added: 1)  Vit B12 1000 mcg [J3420] 2)  Admin of Therapeutic Inj  intramuscular or subcutaneous [[id number]]

## 2010-10-22 NOTE — Assessment & Plan Note (Signed)
Summary: sinusitis   Vital Signs:  Patient profile:   65 year old female Height:      63 inches Weight:      184 pounds BMI:     32.71 O2 Sat:      98 % on Room air Temp:     99.2 degrees F oral Pulse rate:   81 / minute BP sitting:   126 / 85  (left arm) Cuff size:   regular  Vitals Entered By: Payton Spark CMA (October 01, 2009 10:14 AM)  O2 Flow:  Room air CC: ST, Congestion, Cough, Ear Ache, HA x 5 days.    Primary Care Provider:  Seymour Bars DO  CC:  ST, Congestion, Cough, Ear Ache, and HA x 5 days. Marland Kitchen  History of Present Illness: 65 yo WF presents for sore throat, runny nose and cough with bilat ear pain.  Has had subjective fevers and chills.  She did get a flu shot this year.  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  She is taking Mucinex, Tylenol, an inhaler which helps for a few hrs.  She is fatigued but no bodyaches.  She feels SOB.  She is coughing up green phlegm.  She has clear rhinorrhea.She  has chest tightness and wheezing.  The cough is keeping her up at night.  No GI upset.  No rash.  She is having HA.  She is drinking plenty of clears.    Allergies: 1)  ! Percocet  Review of Systems      See HPI  Physical Exam  General:  alert, well-developed, well-nourished, and well-hydrated.   Head:  normocephalic and atraumatic.  maxillary sinuses NTTP Eyes:  eyes slightly watery Ears:  EACs patent; TMs translucent and gray with good cone of light and bony landmarks.  Nose:  nasal congestion present Mouth:  throat mildly injected with clear postnasal drip Neck:  no masses.   Lungs:  Normal respiratory effort, chest expands symmetrically. Lungs are clear to auscultation, no crackles or wheezes. Heart:  Normal rate and regular rhythm. S1 and S2 normal without gallop, murmur, click, rub or other extra sounds. Skin:  color normal.   Cervical Nodes:  No lymphadenopathy noted Psych:  flat affect.     Impression & Recommendations:  Problem # 1:   ACUTE MAXILLARY SINUSITIS (ICD-461.0) Treat with Avelox x 10 days.  Continue supportive care measures. Call if not improved in 10 days. Her updated medication list for this problem includes:    Avelox 400 Mg Tabs (Moxifloxacin hcl) .Marland Kitchen... 1 tab by mouth daily x 9 days  (#1 tab given today as sample)    Benzonatate 200 Mg Caps (Benzonatate) .Marland Kitchen... 1 capsule by mouth three times a day as needed cough  Problem # 2:  COUGH (ICD-786.2) Cough is secondary to mucous drainiage from ABS. Treat with Guadalupe Dawn and her Ventolin HFA 3 x a day.  Complete Medication List: 1)  Lipitor 40 Mg Tabs (Atorvastatin calcium) .... Take 1 tablet by mouth once a day 2)  Clonazepam 0.5 Mg Tabs (Clonazepam) .... Take 1 tablet by mouth once a day at bedtime 3)  Aspirin 81 Mg Tabs (Aspirin) .Marland Kitchen.. 1 tab by mouth once daily 4)  Thyroid 1.5 Grain  .Marland Kitchen.. 1 tab by mouth daily (eviqualent to 88 micrograms synthroid) 5)  Ramipril 5 Mg Caps (Ramipril) .Marland Kitchen.. 1 tab by mouth daily 6)  Graduated Knee High Compression Hose  .... #1 pair  wear daily 7)  Citalopram Hydrobromide 20 Mg Tabs (Citalopram hydrobromide) .Marland Kitchen.. 1 tab by mouth daily 8)  Vitamin D 400 Unit Caps (Cholecalciferol) .Marland Kitchen.. 1 tab by mouth once daily 9)  Calcium/c/d 500-10-250 Mg-mg-unit Chew (Calcium-vitamins c & d) .Marland Kitchen.. 1 tab by mouth once daily 10)  Xibrom 0.09 % Soln (Bromfenac sodium) .... As directed 11)  Pred Forte 1 % Susp (Prednisolone acetate) .... As directed 12)  Zymar 0.3 % Soln (Gatifloxacin) .... S directed 13)  Avelox 400 Mg Tabs (Moxifloxacin hcl) .Marland Kitchen.. 1 tab by mouth daily x 9 days 14)  Benzonatate 200 Mg Caps (Benzonatate) .Marland Kitchen.. 1 capsule by mouth three times a day as needed cough  Patient Instructions: 1)  Take 10 days total of Avelox to cover for sinusitis and bronchitis. 2)  Use Ventolin inhaler 2 puffs 3 x a day for the next wk. 3)  Use Tylenol Cold and Sinus for congestion relieif and Tessalon Perles as needed for cough. 4)  Call if not  improved in 7-10 days. Prescriptions: BENZONATATE 200 MG CAPS (BENZONATATE) 1 capsule by mouth three times a day as needed cough  #30 x 0   Entered and Authorized by:   Seymour Bars DO   Signed by:   Seymour Bars DO on 10/01/2009   Method used:   Electronically to        [facility] # [phone]* (retail)       [address] [place], [place]  [address]       Ph: [phone]       Fax: [phone]   RxID:   [id number] AVELOX 400 MG TABS (MOXIFLOXACIN HCL) 1 tab by mouth daily x 9 days  #9 tabs x 0   Entered and Authorized by:   Seymour Bars DO   Signed by:   Seymour Bars DO on 10/01/2009   Method used:   Electronically to        [facility] # [phone]* (retail)       [address] [place], [place]  [address]       Ph: [phone]       Fax: [phone]   RxID:   [id number]

## 2010-10-22 NOTE — Assessment & Plan Note (Signed)
Summary: B12  Nurse Visit   Vitals Entered By: Payton Spark CMA (January 08, 2010 8:39 AM)  Allergies: 1)  ! Percocet  Medication Administration  Injection # 1:    Medication: Vit B12 1000 mcg    Diagnosis: ANEMIA, PERNICIOUS (ICD-281.0)    Route: IM    Site: R deltoid    Exp Date: 12/12    Lot #: [id number]    Patient tolerated injection without complications    Given by: Payton Spark CMA (January 08, 2010 8:40 AM)  Orders Added: 1)  Vit B12 1000 mcg [J3420] 2)  Admin of Therapeutic Inj  intramuscular or subcutaneous [96372] 3)  T-Vitamin B12 [[id number]]   Medication Administration  Injection # 1:    Medication: Vit B12 1000 mcg    Diagnosis: ANEMIA, PERNICIOUS (ICD-281.0)    Route: IM    Site: R deltoid    Exp Date: 12/12    Lot #: [id number]    Patient tolerated injection without complications    Given by: Payton Spark CMA (January 08, 2010 8:40 AM)  Orders Added: 1)  Vit B12 1000 mcg [J3420] 2)  Admin of Therapeutic Inj  intramuscular or subcutaneous [96372] 3)  T-Vitamin B12 [[id number]]

## 2010-10-22 NOTE — Assessment & Plan Note (Signed)
Summary: B12 INJ  Nurse Visit   Vitals Entered By: Payton Spark CMA (November 14, 2009 8:58 AM)  Allergies: 1)  ! Percocet  Medication Administration  Injection # 1:    Medication: Vit B12 1000 mcg    Diagnosis: ANEMIA, PERNICIOUS (ICD-281.0)    Route: IM    Site: L deltoid    Exp Date: 10/12    Lot #: [id number]    Patient tolerated injection without complications    Given by: Payton Spark CMA (November 14, 2009 9:03 AM)  Orders Added: 1)  Vit B12 1000 mcg [J3420] 2)  Admin of Therapeutic Inj  intramuscular or subcutaneous [96372]   Medication Administration  Injection # 1:    Medication: Vit B12 1000 mcg    Diagnosis: ANEMIA, PERNICIOUS (ICD-281.0)    Route: IM    Site: L deltoid    Exp Date: 10/12    Lot #: [id number]    Patient tolerated injection without complications    Given by: Payton Spark CMA (November 14, 2009 9:03 AM)  Orders Added: 1)  Vit B12 1000 mcg [J3420] 2)  Admin of Therapeutic Inj  intramuscular or subcutaneous [16109]

## 2010-10-22 NOTE — Progress Notes (Signed)
  Phone Note Call from Patient   Caller: Patient Summary of Call: pt called to schedule an appt and c/o chest pain. pt states chest tightness started 08-12-10. then today after waking she developed a sharpe type pain in her left chest. the pain is there all the time and there is no movement or anything that makes the pain worse. no SOB or other symptoms. she has had some stomach upset and will try antiacids. she was told to go to the ER for eval but pt wants to wait until appt wed this week. she knows to go to the ER if symptoms worsen or change before wed. her bp is 128/76. Initial call taken by: Deliah Goody, RN,  August 17, 2010 12:20 PM

## 2010-10-22 NOTE — Miscellaneous (Signed)
Summary: Orders Update  Clinical Lists Changes  Orders: Added new Test order of TLB-BMP (Basic Metabolic Panel-BMET) (80048-METABOL) - Signed 

## 2010-10-22 NOTE — Progress Notes (Signed)
Summary: Clonazepam refill  Phone Note Refill Request   Refills Requested: Medication #1:  CLONAZEPAM 0.5 MG  TABS Take 1 tablet by mouth once a day at bedtime Pt is leaving to go out of town on Fri 7am and would like to pick this up tomorrow evening.   Initial call taken by: Payton Spark CMA,  January 14, 2010 3:45 PM    Prescriptions: CLONAZEPAM 0.5 MG  TABS (CLONAZEPAM) Take 1 tablet by mouth once a day at bedtime  #30 x 0   Entered and Authorized by:   Seymour Bars DO   Signed by:   Seymour Bars DO on 01/14/2010   Method used:   Printed then faxed to ...       [facility] [address] # [phone]* (retail)       [address] [place], [place]  [address]       Ph: [phone]       Fax: [phone]   RxID:   [id number]

## 2010-10-22 NOTE — Progress Notes (Signed)
Summary: FYI- trying to decrease clonazepam dose  Phone Note Call from Patient   Caller: Patient Summary of Call: FYI- Pt states she is going to try to cut down on clonazepam so she has only been taking 1/2 tab.  Initial call taken by: Payton Spark CMA,  Jan 19, 2010 9:40 AM

## 2010-10-22 NOTE — Assessment & Plan Note (Signed)
Summary: B12  Nurse Visit   Vitals Entered By: Payton Spark CMA (December 12, 2009 8:52 AM)  Allergies: 1)  ! Percocet  Medication Administration  Injection # 1:    Medication: Vit B12 1000 mcg    Diagnosis: ANEMIA, PERNICIOUS (ICD-281.0)    Route: IM    Site: L deltoid    Exp Date: 10/12    Lot #: [id number]    Patient tolerated injection without complications    Given by: Payton Spark CMA (December 12, 2009 8:52 AM)  Orders Added: 1)  Vit B12 1000 mcg [J3420] 2)  Admin of Therapeutic Inj  intramuscular or subcutaneous [96372]   Medication Administration  Injection # 1:    Medication: Vit B12 1000 mcg    Diagnosis: ANEMIA, PERNICIOUS (ICD-281.0)    Route: IM    Site: L deltoid    Exp Date: 10/12    Lot #: [id number]    Patient tolerated injection without complications    Given by: Payton Spark CMA (December 12, 2009 8:52 AM)  Orders Added: 1)  Vit B12 1000 mcg [J3420] 2)  Admin of Therapeutic Inj  intramuscular or subcutaneous [[id number]]

## 2010-11-17 ENCOUNTER — Ambulatory Visit
Admission: RE | Admit: 2010-11-17 | Discharge: 2010-11-17 | Disposition: A | Discharge status: Home / Self Care | Payer: Managed Care, Other (non HMO) | Source: Ambulatory Visit | Attending: Internal Medicine | Admitting: Internal Medicine

## 2010-11-17 ENCOUNTER — Inpatient Hospital Stay: Admission: RE | Admit: 2010-11-17 | Payer: Self-pay | Source: Ambulatory Visit

## 2010-11-17 ENCOUNTER — Other Ambulatory Visit: Payer: Self-pay | Admitting: Internal Medicine

## 2010-11-17 DIAGNOSIS — Z139 Encounter for screening, unspecified: Secondary | ICD-10-CM

## 2010-12-22 LAB — BASIC METABOLIC PANEL
CO2: 28 mEq/L (ref 19–32)
Calcium: 8.9 mg/dL (ref 8.4–10.5)
GFR calc Af Amer: >60 mL/min (ref >60)
GFR calc non Af Amer: >60 mL/min (ref >60)
Potassium: 3.9 mEq/L (ref 3.5–5.1)
Sodium: 140 mEq/L (ref 135–145)

## 2011-02-02 NOTE — Assessment & Plan Note (Signed)
[facility]                            CARDIOLOGY OFFICE NOTE   MADELLYN, Wu                   MRN:          [id number]  DATE:07/17/2008                            DOB:          [birth year]    [name] is an extremely pleasant 65 year old female who I am  asked to evaluate for aortic insufficiency and a dilated aortic root.  She does state that she has a history of mitral valve prolapse, but  otherwise has no cardiac history.  She has dyspnea with more extreme  activities, but not with routine activities.  There is no orthopnea,  PND, palpitations, chest pain, or syncope.  She has been on Benicar HCT  in the past for hypertension.  However, her blood pressure apparently  was recently low and her HCT was discontinued.  She subsequently  developed pedal edema.  She was then placed on Lasix, which did not help  her pedal edema, but it made her dehydrated.  It was therefore  discontinued and an echocardiogram was ordered to assess her LV  function.  This was performed on July 11, 2008.  I do not have the  images to review, but the report entered by Dr. Peter Swaziland suggests  her ejection fraction was 55-60%.  The aortic valve was trileaflet, but  there was mild-to-moderate aortic insufficiency.  There was moderate  aortic root dilatation measuring 4.6 cm.  There was mild tricuspid  regurgitation.  Because of the above, we were asked to further evaluate.   PRESENT MEDICATIONS:  1. Benicar 20 mg p.o. daily.  2. Lipitor 40 mg p.o. daily.  3. Vitamin D.  4. Aspirin 81 mg p.o. daily.  5. Synthroid.  6. Clonazepam.   ALLERGIES:  She has an allergy to PERCOCET.   SOCIAL HISTORY:  She does not smoke.  She occasionally consumes alcohol.   FAMILY HISTORY:  Significant for her father who had congenital heart  disease, but there is no early coronary artery disease.  Cancer does run  in her family.   PAST MEDICAL HISTORY:  Significant for  hypertension and hyperlipidemia,  but there is no diabetes mellitus.  She does have hypothyroidism.  She  has a history of mitral valve prolapse by her report.  She has had a  prior nephrectomy secondary to renal cell carcinoma.  She also has a  history of diverticulitis.  She has had a previous tonsillectomy,  cholecystectomy, appendectomy, partial hysterectomy, and 2 cesarean  sections.  She has also had hernia repair.  She has had previous knee  surgery x3.   REVIEW OF SYSTEMS:  She denies any headaches, fevers, or chills.  There  is no productive cough or hemoptysis.  There is no dysphagia,  odynophagia, melena, or hematochezia.  There is no dysuria or hematuria.  There is no rash or seizure activity.  There is no orthopnea or PND, but  there is pedal edema.  Remaining systems are negative.   PHYSICAL EXAMINATION:  VITAL SIGNS:  Today, her blood pressure is 124/76  and her pulse is 72.  She weighs 191 pounds.  GENERAL:  She is well developed and somewhat obese.  She is no acute  distress at present.  SKIN:  Warm and dry.  She does appear to be mildly depressed.  There is  no peripheral clubbing.  BACK:  Normal.  HEENT:  Normal with normal eyelids.  NECK:  Supple with normal upstrokes bilaterally.  No bruits noted.  There is no jugular venous distention.  I cannot appreciate thyromegaly.  CHEST:  Clear to auscultation with no expansion.  CARDIOVASCULAR:  Regular rate and rhythm.  Normal S1 and S2.  There is a  1/6 systolic ejection murmur at the left sternal border.  I cannot  appreciate a diastolic murmur.  There is no S3 or S4.  ABDOMEN:  Nontender and nondistended.  Positive bowel sounds.  No  hepatosplenomegaly.  No masses appreciated.  There is no abdominal  bruit.  She has 2+ femoral pulses bilaterally.  No bruits.  EXTREMITIES:  No edema.  I can palpate no cords.  She has 2+ dorsalis  pedis pulses bilaterally.  NEUROLOGIC:  Grossly intact.   Her electrocardiogram shows  sinus rhythm at a rate of 74.  There are no  significant ST changes.   DIAGNOSES:  1. Aortic root dilatation/aneurysm - The patient's echocardiogram      suggest dilatation of the ascending aorta.  We will schedule her to      have a CTA to more fully evaluate.  If it in deed is enlarged, then      she will need to follow up imaging studies in the future.  2. Aortic insufficiency - We will plan to review her echocardiogram.      We will most likely repeat her echocardiogram in 1 year to reassess      her aortic insufficiency.  3. Hypertension - Her blood pressure is adequately controlled on      present medications.  4. Hyperlipidemia - She will continue on her statin.  5. Hypothyroidism.   We will see her back in 4 weeks to review her CT.  Note, we will check a  BMET prior to proceeding.     Brianna Frieze Jens Som, MD, [facility]  Electronically Signed    BSC/MedQ  DD: 07/17/2008  DT: 07/18/2008  Job #: [id number]   cc:   Brianna Wu, D.O.

## 2011-02-02 NOTE — H&P (Signed)
NAME:  Brianna Wu, SKOCZYLAS NO.:  [id number]   MEDICAL RECORD NO.:  [id number]          PATIENT TYPE:  INP   LOCATION:                               FACILITY:  [facility]   PHYSICIAN:  Doylene Canning. Ladona Ridgel, MD    DATE OF BIRTH:  June 22, 1946   DATE OF ADMISSION:  08/21/2008  DATE OF DISCHARGE:                              HISTORY & PHYSICAL   PRIMARY CARDIOLOGIST:  Dr. [name]. Jens Som, MD, [facility].   PRIMARY CARE PHYSICIAN:  Seymour Bars, D.O.   CHIEF COMPLAINT:  Atypical chest pain, rule out ACS.   HISTORY OF PRESENT ILLNESS:  Three days ago, the patient began to  experience left-sided neck pain, which she described as an aching that  would sometimes radiate into her left shoulder down the left arm and  then to the left chest, worst with an 8/10.  It is worst in certain  positions.  It was better today with morphine but at home, the patient  took ibuprofen which did not alleviate the symptoms.  The pain has no  associated symptoms and is not reproducible.  Today, while the patient  was driving, she felt very dizzy for a few seconds, she described this  dizziness as the world spinning, then she felt nauseous and felt heat go  down her arms and legs.  At this time, she had no palpitations, however,  she did feel very anxious afterwards.  She went to see her primary care  physician who sent her to Urgent Care and she was directly admitted from  the Urgent Care.   ALLERGIES:  The patient is allergic to PERCOCET.   MEDICATIONS:  1. Benicar 20 mg daily by mouth.  2. Lipitor 40 mg by mouth daily.  3. Vitamin D 5000 units subcutaneously weekly.  4. Aspirin 81 mg by mouth daily.  5. Synthroid 0.5 mg daily.  6. Clonazepam 0.5 mg daily.  7. Celexa 10 mg by mouth daily.   PAST MEDICAL HISTORY:  She has a history of hypertension which is well  controlled on her current medications; hyperlipidemia, which we do not  know the control; hypothyroidism, unknown control.  Aortic  insufficiency  with an ejection fraction of 55% to 60% on her echo that was completed  in October 2009.  She also has an ascending thoracic aortic dissection  at 4.2 cm by CT angio of the chest on September 16, 2008.   PAST SURGICAL HISTORY:  She is status post left nephrectomy.   SOCIAL HISTORY:  She lives in [place] with her daughter.  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  She does not smoke nor has she ever smoked.  She drinks socially, 1 to 2  drinks per week.  She has no illegal drug use.  She takes omega-3 fatty  acids as herbal medication.  She has low sodium and low saturated fat  diet for the last 3 weeks.  She has no regular exercise but is active.   FAMILY HISTORY:  Her mother is alive, she is 44 years old.  She has  hypertension and has had bladder and lungs CA.  Her father is deceased  at age 2, from heart failure.  He also had lung cancer and kidney  cancer.  She has one brother who is deceased from lung cancer.   REVIEW OF SYSTEMS:  The patient has chronic migraines and currently has  a headache.  She continues to have atypical chest pain in her neck,  upper chest, and left arm.  She has had increasing dyspnea on exertion  for the last 3 to 4 months but no orthopnea or PND.  No other  cardiopulmonary symptoms and all other systems reviewed were negative.   PHYSICAL EXAMINATION:  VITAL SIGNS:  Her temperature at the time of  admission was 97.6 degrees Fahrenheit, her pulse was 55, respiration  rate 18, blood pressure was 128/78, and her O2 saturation was 98% on 2  liters by nasal cannula.  GENERAL:  On exam, she was alert and oriented x3, in no apparent  distress.  HEENT:  Normocephalic, atraumatic.  Pupils were equal and round,  reactive to light.  Extraocular muscles were intact.  NECK:  Supple without lymphadenopathy.  No thyromegaly, no bruits, and  no JVD.  No lymphadenopathy was noted on exam.  CARDIOVASCULAR:   She had a heart rate that was regular and rhythm was  regular as well with audible S1 and S2.  Pulses were 2+ and equal  without bruits in the upper and lower extremities bilaterally.  LUNGS:  Clear to auscultation bilaterally.  SKIN:  No rashes, lesions, or petechiae.  ABDOMEN:  Soft, nontender.  Normal bowel sounds.  No hepatosplenomegaly.  EXTREMITIES:  No clubbing, cyanosis, or edema.  MUSCULOSKELETAL:  No joint deformities, fusions, spinal or CVA  tenderness.  NEURO:  Cranial nerves II through XII grossly intact.  Strength was 5/5  in all extremities and axial groups.  She had normal sensation and had  normal cerebellar function.   The patient had a chest x-ray today that showed no acute cardiopulmonary  disease.  Her EKG showed sinus bradycardia with a rate of 54, normal  access, and no Q-wave.  Her PR interval was 140, QRS 94, QTC of 455.  No  previous EKG was available and no acute ST or T wave changes.   LABORATORY DATA:  White blood cell count was 5.4, hemoglobin was 14.1,  hematocrit was 41.8, and her platelet count was 242.  Her sodium was  144, potassium was 3.4, chloride was 106, CO2 was 29, BUN was 22,  creatinine was 1.1, and glucose was 99.  Her CK-MB was 4.0 and troponin  I was less than 0.05.  INR was 0.4 and calcium was 9.8.   ASSESSMENT:  1. Recurrent chest pain, typical and atypical features that is      persistent.  2. Near syncope.  3. Ascending aortic aneurysm, by CT scan.  4. Mild aortic insufficiency.  5. Hypertension, controlled with meds.   PLAN:  1. To admit and observe.  2. We will plan for CT of the chest to rule out aortic dissection.  3. She is ruled out for MI.  We will consider outpatient stress test.  4. We will hydrate prior to CT of the chest due to solitary kidney.      Jarrett Ables, PAC      Doylene Canning. Ladona Ridgel, MD  Electronically Signed    MS/MEDQ  D:  08/21/2008  T:  08/22/2008  Job:  [id number]

## 2011-02-02 NOTE — Assessment & Plan Note (Signed)
[facility]                            CARDIOLOGY OFFICE NOTE   AZILE, MINARDI                   MRN:          [id number]  DATE:09/05/2008                            DOB:          1946-02-26    [name] is a pleasant 65 year old female, who I have followed  for dilated aortic root and aortic insufficiency.  Since I last saw her,  she was admitted to [facility] with the presyncopal episode and  chest pain.  She ruled out for myocardial infarction with serial  enzymes.  She had a followup CT on August 22, 2008.  There is mild  aneurysmal dilatation of the ascending thoracic aorta, but no dissection  or hemorrhage was noted.  There is no pulmonary embolus.  She had normal  CT of her abdomen with no evidence of aortic pathology.  There was a  solitary right kidney supplied by 2 widely patent renal arteries.  We  have also scheduled her to have a Myoview as an outpatient, which was  performed on August 26, 2008.  This showed mild apical thinning, but no  ischemia or infarction.  Her ejection fraction is 74%.  She is also now  on a CardioNet monitor.  Since discharge, she denies any increased  dyspnea on exertion, orthopnea, PND, pedal edema, palpitations, or  syncope.  She does occasionally have chest pain.  The pain feels like a  tightness.  She notices this more with stress and only it lasts seconds.  Note, she does not have exertional chest pain.   MEDICATIONS:  1. Aspirin 81 mg p.o. daily.  2. Benicar 20 mg p.o. daily.  3. Lipitor 40 mg p.o. daily.  4. Synthroid 50 mcg p.o. daily.  5. Clonazepam 0.5 mg daily nightly.  6. Celexa 10 mg p.o. daily.  7. Multivitamin.   PHYSICAL EXAMINATION:  VITAL SIGNS:  Today, shows a blood pressure of  108/70 and her pulse is 59.  She weighs 188 pounds.  HEENT:  Normal.  NECK:  Supple.  CHEST:  Clear.  CARDIOVASCULAR:  Regular rate and rhythm.  ABDOMEN:  No tenderness.  EXTREMITIES:   No edema.   Her electrocardiogram shows a sinus rhythm at a rate of 59.  The axis is  normal.  A prior inferolateral infarct cannot be excluded.   DIAGNOSES:  1. Recent presyncopal episode - etiology of this is unclear to me.      She will continue on her CardioNet monitor and we will await those      results.  2. Chest pain - her symptoms are atypical and her Myoview shows normal      perfusion.  We will not pursue this further at this point.  3. Aortic root dilatation/aneurysm - she will need a followup CT in      December 2010.  We also could instead perform an MRI to avoid      radiation exposure.  4. Aortic insufficiency - she will need a followup echocardiogram in      December 2010 as well.  5. Hypertension - her blood pressure is adequately controlled  on      present medications.  6. Hyperlipidemia - she will continue on her statin and this has been      monitored by Dr. Cathey Endow.  7. Hypothyroidism.   I will see her back in 6 months.     [name] Jens Som, MD, [facility]  Electronically Signed    BSC/MedQ  DD: 09/05/2008  DT: 09/06/2008  Job #: [id number]   cc:   Seymour Bars, D.O.

## 2011-02-02 NOTE — Discharge Summary (Signed)
NAME:  Brianna Wu, Brianna Wu NO.:  [id number]   MEDICAL RECORD NO.:  [id number]          PATIENT TYPE:  INP   LOCATION:  3729                         FACILITY:  [facility]   PHYSICIAN:  [name]. Jens Som, MD, FACCDATE OF BIRTH:  11-11-1945   DATE OF ADMISSION:  08/21/2008  DATE OF DISCHARGE:  08/22/2008                               DISCHARGE SUMMARY   PRIMARY CARDIOLOGIST:  [name]. Jens Som, MD, [facility]   PRIMARY CARE Wyona Neils:  Seymour Bars, DO   DISCHARGE DIAGNOSIS:  Atypical chest pain.   SECONDARY DIAGNOSES:  1. Presyncope.  2. Hypertension.  3. Hyperlipidemia.  4. Hypothyroidism.  5. Mild-to-moderate aortic insufficiency.  6. Ascending thoracic aortic dilatation measuring 4.0 cm by CT on      August 22, 2008.   ALLERGIES:  PERCOCET.   PROCEDURES:  CT angio of the chest, abdomen, and pelvis.   HISTORY OF PRESENT ILLNESS:  This is a 65 year old female with the above  problem list.  She was in her usual state of health when approximately 3  days prior to admission where she began to experience 8/10 constant left  shoulder, arm, and chest discomfort, unrelieved by ibuprofen at home.  Symptoms were persistent over 3 days with intermittent worsening.  There  were no specific associated or alleviating factors.  On the day of  admission, she was driving and had a brief episode of nausea with  diaphoresis and dizziness as though the world was spinning.  She was  very anxious and saw her primary care Jaquila Santelli who was sent onto the  [facility] and then she was subsequently transferred to  [facility] for further evaluation.   HOSPITAL COURSE:  The patient ruled out for MI by cardiac markers.  A CT  angio of the chest was performed, showed no evidence of aortic  dissection, stable size of her ascending thoracic aorta.  CT of the  abdomen and pelvis was also normal.  On the morning of August 22, 2008,  she denied any chest discomfort.  She is being  discharged home today in  good condition.  We will arrange for her to undergo an exercise Myoview  on Monday, August 26, 2008, at 11:45 a.m.  We will also arrange for her  to pick up a CardioNet monitor given her complaints of presyncope prior  to admission.  She is being discharged home today in good condition.   DISCHARGE LABORATORY DATA:  Hemoglobin 12.8, hematocrit 37.5, WBC 5.2,  platelets 190.  Sodium 141, potassium 4.0, chloride 107, CO2 29, BUN 15,  creatinine 0.92, glucose 95.  Total bilirubin 0.5, alkaline phosphatase  53, AST 19, ALT 13, total protein 5.4, albumin 3.0, calcium 8.6,  magnesium 2.2.  CK 62, MB 1.4, troponin-I less than 0.01.  Total  cholesterol 99, triglycerides 55, HDL 33, LDL 55.   DISPOSITION:  The patient will be discharged home today in good  condition.   FOLLOWUP PLANS AND APPOINTMENTS:  We have arranged for her to follow up  with an outpatient exercise Myoview on August 26, 2008, at 11:45 a.m.  She will also obtain  a CardioNet monitor at that time.  She will follow  up with Dr. Jens Som on September 05, 2008, at 11:00 a.m.  She will  follow up with Dr. Cathey Endow as scheduled.   DISCHARGE MEDICATIONS:  1. Aspirin 81 mg daily.  2. Benicar 20 mg daily.  3. Lipitor 40 mg daily.  4. Synthroid 50 mcg daily.  5. Clonazepam 0.5 mg nightly.  6. Celexa 10 mg daily.   OUTSTANDING LABORATORY DATA AND STUDIES:  None.   DURATION OF DISCHARGE ENCOUNTER:  45 minutes including physician time.      Nicolasa Ducking, ANP      [name]. Jens Som, MD, [facility]  Electronically Signed    CB/MEDQ  D:  08/22/2008  T:  08/22/2008  Job:  [id number]   cc:   Seymour Bars, D.O.

## 2011-02-02 NOTE — Assessment & Plan Note (Signed)
NAME:  Brianna Wu, Brianna Wu NO.:  [id number]   MEDICAL RECORD NO.:  [id number]          PATIENT TYPE:  POB   LOCATION:  [facility] [place]         FACILITY:  [facility]   PHYSICIAN:  Allie Bossier, MD        DATE OF BIRTH:  1945/09/30   DATE OF SERVICE:                                  CLINIC NOTE   Brianna Wu is a 65 year old divorced white gravida 2, para 2 who is  also raising an adopted child.  She comes in for her annual exam.  Her  complaint today is that of a possible vaginal odor.  She is unsure if it  is caused by her use of lavender-scented baby oil on her vulva or a  possible yeast infection.  She has been abstinent for 6 years, but is  hopeful that at some point in the future she may have sex and wishes to  be treated for vaginal atrophy.   PAST MEDICAL HISTORY:  1. High cholesterol.  2. Hyperthyroid.  3. Obesity.  4. Hypertension.  5. Bursitis.   REVIEW OF SYSTEMS:  Abstinent for the last 6 years.  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  Her family  practice physician is Dr. Cathey Endow.  Her mammogram was normal this year.  Her last Pap smear was done in 2007 and she has never had abnormal Pap  smears and she has had a colonoscopy, but is not sure when her next one  is due.   PREVIOUS SURGERY:  She has had a left nephrectomy in 2005 for renal  cancer.  She has had a TAH followed by individual removals of her adnexa  on two other occasions.  She had a tonsillectomy.  She has had direct  and indirect inguinal hernia repairs.  She had C-section twice,  cholecystectomy, and left knee surgery in 1978.  She had a gastric  bypass in 1979.   FAMILY HISTORY:  Negative for GYN and colon cancers, but positive for  breast cancer.  Her mother developed this in the postmenopausal age  range.  Sister has Crohn disease.   No LATEX allergies.  She is allergic to [facility].   MEDICATIONS:  1. Clonazepam 0.5 mg as necessary.  2.  Lasix 20 mg daily.  3. Benicar 20 mg daily.  4. Lipitor 40 mg daily.  5. Synthroid 0.5 mg daily.  6. She gets periodic steroid injections in her right hip for bursitis      and she takes Excedrin Migraine as necessary.   PHYSICAL EXAMINATION:  VITAL SIGNS:  Weight 188 pounds, height 5 feet 3  inches, blood pressure 104/64, and pulse 61.  HEENT:  Normal.  BREAST:  Bilateral fibrocystic changes.  No skin changes or nipple  discharge or discrete masses.  LUNGS:  Clear to auscultation bilaterally.  ABDOMEN:  Multiple scars.  Moderately obese.  No hepatosplenomegaly  palpated.  EXTERNAL GENITALIA:  Moderate atrophy, normal-to-no discharge.  Bimanual  exam, there were no masses.   ASSESSMENT AND PLAN:  Annual exam.  I have recommended self-breast exam,  self-vulvar exams monthly with regard to her vaginal discomfort/odor.  I  have sent a  wet prep and given her prescription for Vagifem tablets 25  mcg to be used every night for 2 weeks and then twice a week.  I have  recommended to her that she start a multivitamin daily and find out when  her colonoscopy is due.      Allie Bossier, MD     MCD/MEDQ  D:  05/01/2008  T:  05/02/2008  Job:  [id number]

## 2011-05-04 ENCOUNTER — Encounter: Payer: Self-pay | Admitting: Cardiology

## 2011-06-25 LAB — PROTIME-INR: INR: 0.9 (ref 0.00–1.49)

## 2011-06-25 LAB — CBC
HCT: 37.5 % (ref 36.0–46.0)
Hemoglobin: 14.1 g/dL (ref 12.0–15.0)
MCHC: 33.8 g/dL (ref 30.0–36.0)
MCV: 90.7 fL (ref 78.0–100.0)
Platelets: 190 10*3/uL (ref 150–400)
Platelets: 195 10*3/uL (ref 150–400)
RBC: 4.14 MIL/uL (ref 3.87–5.11)
RBC: 4.7 MIL/uL (ref 3.87–5.11)
RDW: 12.6 % (ref 11.5–15.5)
WBC: 5.2 10*3/uL (ref 4.0–10.5)

## 2011-06-25 LAB — COMPREHENSIVE METABOLIC PANEL
Albumin: 3 g/dL — ABNORMAL LOW (ref 3.5–5.2)
Alkaline Phosphatase: 53 U/L (ref 39–117)
BUN: 15 mg/dL (ref 6–23)
GFR calc Af Amer: >60 mL/min (ref >60)
Potassium: 4 mEq/L (ref 3.5–5.1)
Sodium: 141 mEq/L (ref 135–145)
Total Protein: 5.4 g/dL — ABNORMAL LOW (ref 6.0–8.3)

## 2011-06-25 LAB — BASIC METABOLIC PANEL
CO2: 29 mEq/L (ref 19–32)
Calcium: 9.8 mg/dL (ref 8.4–10.5)
Creatinine, Ser: 1.1 mg/dL (ref 0.4–1.2)
GFR calc Af Amer: >60 mL/min (ref >60)
GFR calc non Af Amer: 50 mL/min — ABNORMAL LOW (ref >60)
Glucose, Bld: 99 mg/dL (ref 70–99)
Potassium: 3.9 mEq/L (ref 3.5–5.1)
Sodium: 144 mEq/L (ref 135–145)

## 2011-06-25 LAB — CARDIAC PANEL(CRET KIN+CKTOT+MB+TROPI)
CK, MB: 1.3 ng/mL (ref 0.3–4.0)
CK, MB: 1.5 ng/mL (ref 0.3–4.0)
Relative Index: INVALID (ref 0.0–2.5)
Total CK: 53 U/L (ref 7–177)
Troponin I: 0.01 ng/mL (ref 0.00–0.06)
Troponin I: <0.01 ng/mL (ref 0.00–0.06)

## 2011-06-25 LAB — DIFFERENTIAL
Basophils Absolute: 0 10*3/uL (ref 0.0–0.1)
Basophils Relative: 1 % (ref 0–1)
Lymphocytes Relative: 28 % (ref 12–46)
Monocytes Relative: 6 % (ref 3–12)
Neutro Abs: 3.6 10*3/uL (ref 1.7–7.7)
Neutrophils Relative %: 62 % (ref 43–77)

## 2011-06-25 LAB — LIPID PANEL
HDL: 33 mg/dL — ABNORMAL LOW (ref >39)
Triglycerides: 55 mg/dL (ref <150)

## 2011-06-25 LAB — POCT CARDIAC MARKERS: Myoglobin, poc: 45.8 ng/mL (ref 12–200)

## 2011-06-25 LAB — TSH: TSH: 4.349 u[IU]/mL (ref 0.350–4.500)

## 2011-10-21 ENCOUNTER — Ambulatory Visit (INDEPENDENT_AMBULATORY_CARE_PROVIDER_SITE_OTHER): Payer: Managed Care, Other (non HMO) | Admitting: Cardiology

## 2011-10-21 ENCOUNTER — Encounter: Payer: Self-pay | Admitting: Cardiology

## 2011-10-21 DIAGNOSIS — I719 Aortic aneurysm of unspecified site, without rupture: Secondary | ICD-10-CM

## 2011-10-21 DIAGNOSIS — E785 Hyperlipidemia, unspecified: Secondary | ICD-10-CM

## 2011-10-21 DIAGNOSIS — I10 Essential (primary) hypertension: Secondary | ICD-10-CM

## 2011-10-21 DIAGNOSIS — I38 Endocarditis, valve unspecified: Secondary | ICD-10-CM

## 2011-10-21 NOTE — Patient Instructions (Signed)

## 2011-10-21 NOTE — Assessment & Plan Note (Signed)
Blood pressure controlled. Continue present medications. 

## 2011-10-21 NOTE — Assessment & Plan Note (Signed)
Management per primary care. 

## 2011-10-21 NOTE — Progress Notes (Signed)
HPI: [name] is a pleasant  female, who I have followed for dilated aortic root and aortic insufficiency.  She had a CT on August 22, 2008.  There was mild aneurysmal dilatation of the ascending thoracic aorta, but no dissection or hemorrhage was noted.  There is no pulmonary embolus.  She had a normal CT of her abdomen with no evidence of aortic pathology.  There was a solitary right kidney supplied by 2 widely patent renal arteries.  She did have a Myoview as an outpatient, which was performed on August 26, 2008.  This showed mild apical thinning, but no ischemia or infarction.  Her ejection fraction is 74%.  A CardioNet monitor for palpitations in December of 2009 showed sinus to sinus tachycardia with short run of PAT and a rare PVC. An echocardiogram in Dec 2010 showed normal LV function, mild  aortic insufficiency and mild mitral regurgitation. MRA of the aortic root in Dec 2011 showed a stable dilatation at 4 cm. I last saw her in Nov 2011. Since then the patient denies any dyspnea on exertion, orthopnea, PND, pedal edema, palpitations, syncope or chest pain.   Current Outpatient Prescriptions  Medication Sig Dispense Refill  . aspirin 81 MG tablet Take 81 mg by mouth daily.        . cholecalciferol (VITAMIN D) 1000 UNITS tablet Take 1,000 Units by mouth 5 (five) times daily.      . fish oil-omega-3 fatty acids 1000 MG capsule Take 2 g by mouth daily.      Marland Kitchen levothyroxine (SYNTHROID, LEVOTHROID) 88 MCG tablet Take 88 mcg by mouth daily.      . magnesium gluconate (MAGONATE) 500 MG tablet Take 400 mg by mouth 4 (four) times daily.      . Multiple Vitamin (MULTIVITAMIN) tablet Take 1 tablet by mouth daily.      . progesterone (PROMETRIUM) 100 MG capsule Take 100 mg by mouth daily.      Marland Kitchen pyridOXINE (VITAMIN B-6) 100 MG tablet Take 100 mg by mouth daily.      . ramipril (ALTACE) 5 MG capsule Take 5 mg by mouth daily.        . Red Yeast Rice 600 MG CAPS Take by mouth daily.      .  vitamin C (ASCORBIC ACID) 500 MG tablet Take 500 mg by mouth daily.      . Zinc Gluconate 30 MG TABS Take 1 tablet by mouth daily.         Past Medical History  Diagnosis Date  . Scoliosis   . Renal cell carcinoma 2004    L stage I  . HTN (hypertension)   . HLD (hyperlipidemia)   . Pernicious anemia   . Depressed   . Heart disease     vvalvular with dilated aortic root 4.2cm (echo 10-09)  . Aortic insufficiency     Past Surgical History  Procedure Date  . Tonsillectomy 1950  . Inguinal hernia repair 1970  . Ltcs     x2  . Cholecystectomy 1974  . Total abdominal hysterectomy 1976  . Arthroscopic knee     x2  . L nephrectomy for stage rcc 2005  . Stomach stapling   . Bilat cataract surgery     Dr. Hermenia Fiscal    History   Social History  . Marital Status: Divorced    Spouse Name: N/A    Number of Children: N/A  . Years of Education: N/A   Occupational History  .  Not on file.   Social History Main Topics  . Smoking status: Never Smoker   . Smokeless tobacco: Not on file  . Alcohol Use: Not on file  . Drug Use:   . Sexually Active:    Other Topics Concern  . Not on file   Social History Narrative   Does not exercise. Is not sexually active.     ROS: no fevers or chills, productive cough, hemoptysis, dysphasia, odynophagia, melena, hematochezia, dysuria, hematuria, rash, seizure activity, orthopnea, PND, pedal edema, claudication. Remaining systems are negative.  Physical Exam: Well-developed well-nourished in no acute distress.  Skin is warm and dry.  HEENT is normal.  Neck is supple. No thyromegaly.  Chest is clear to auscultation with normal expansion.  Cardiovascular exam is regular rate and rhythm.  Abdominal exam nontender or distended. No masses palpated. Extremities show no edema. neuro grossly intact  ECG sinus rhythm at a rate of 70. Prior inferior infarct.

## 2011-10-21 NOTE — Assessment & Plan Note (Signed)
Plan repeat echocardiogram to assess aortic root now. Repeat MRA in one year when she returns. Note her most recent MRA showed dimensions to be stable at 4 cm.

## 2011-10-21 NOTE — Assessment & Plan Note (Signed)
Will plan repeat echocardiogram to assess aortic insufficiency and aortic root.

## 2011-10-27 ENCOUNTER — Ambulatory Visit (HOSPITAL_COMMUNITY): Payer: Managed Care, Other (non HMO) | Attending: Cardiovascular Disease | Admitting: Radiology

## 2011-10-27 DIAGNOSIS — I719 Aortic aneurysm of unspecified site, without rupture: Secondary | ICD-10-CM

## 2011-10-27 DIAGNOSIS — E785 Hyperlipidemia, unspecified: Secondary | ICD-10-CM | POA: Insufficient documentation

## 2011-10-27 DIAGNOSIS — I10 Essential (primary) hypertension: Secondary | ICD-10-CM | POA: Insufficient documentation

## 2011-10-27 DIAGNOSIS — I359 Nonrheumatic aortic valve disorder, unspecified: Secondary | ICD-10-CM

## 2011-10-27 DIAGNOSIS — R079 Chest pain, unspecified: Secondary | ICD-10-CM | POA: Insufficient documentation

## 2011-10-27 DIAGNOSIS — I38 Endocarditis, valve unspecified: Secondary | ICD-10-CM

## 2011-10-27 DIAGNOSIS — Z85528 Personal history of other malignant neoplasm of kidney: Secondary | ICD-10-CM | POA: Insufficient documentation

## 2011-10-27 DIAGNOSIS — R609 Edema, unspecified: Secondary | ICD-10-CM | POA: Insufficient documentation

## 2011-11-12 ENCOUNTER — Ambulatory Visit
Admission: RE | Admit: 2011-11-12 | Discharge: 2011-11-12 | Disposition: A | Discharge status: Home / Self Care | Payer: Managed Care, Other (non HMO) | Source: Ambulatory Visit | Attending: Internal Medicine | Admitting: Internal Medicine

## 2011-11-12 DIAGNOSIS — Z1231 Encounter for screening mammogram for malignant neoplasm of breast: Secondary | ICD-10-CM

## 2012-10-17 ENCOUNTER — Other Ambulatory Visit: Payer: Self-pay | Admitting: Internal Medicine

## 2012-10-17 DIAGNOSIS — Z1231 Encounter for screening mammogram for malignant neoplasm of breast: Secondary | ICD-10-CM

## 2012-11-15 ENCOUNTER — Ambulatory Visit
Admission: RE | Admit: 2012-11-15 | Discharge: 2012-11-15 | Disposition: A | Discharge status: Home / Self Care | Payer: Medicare Other | Source: Ambulatory Visit | Attending: Internal Medicine | Admitting: Internal Medicine

## 2013-11-01 ENCOUNTER — Other Ambulatory Visit: Payer: Self-pay

## 2013-11-01 DIAGNOSIS — Z1231 Encounter for screening mammogram for malignant neoplasm of breast: Secondary | ICD-10-CM

## 2013-11-22 ENCOUNTER — Other Ambulatory Visit: Payer: Self-pay | Admitting: *Deleted

## 2013-11-22 ENCOUNTER — Ambulatory Visit
Admission: RE | Admit: 2013-11-22 | Discharge: 2013-11-22 | Disposition: A | Discharge status: Home / Self Care | Payer: Medicare Other | Source: Ambulatory Visit | Attending: *Deleted | Admitting: *Deleted

## 2013-11-22 ENCOUNTER — Ambulatory Visit
Admission: RE | Admit: 2013-11-22 | Discharge: 2013-11-22 | Disposition: A | Discharge status: Home / Self Care | Payer: Medicare Other | Source: Ambulatory Visit

## 2013-11-22 DIAGNOSIS — N644 Mastodynia: Secondary | ICD-10-CM

## 2013-11-22 DIAGNOSIS — Z1231 Encounter for screening mammogram for malignant neoplasm of breast: Secondary | ICD-10-CM

## 2016-11-11 DIAGNOSIS — K219 Gastro-esophageal reflux disease without esophagitis: Secondary | ICD-10-CM | POA: Diagnosis not present

## 2016-11-11 DIAGNOSIS — M79602 Pain in left arm: Secondary | ICD-10-CM | POA: Diagnosis not present

## 2016-11-11 DIAGNOSIS — Z886 Allergy status to analgesic agent status: Secondary | ICD-10-CM | POA: Diagnosis not present

## 2016-11-11 DIAGNOSIS — Z885 Allergy status to narcotic agent status: Secondary | ICD-10-CM | POA: Diagnosis not present

## 2016-11-11 DIAGNOSIS — I10 Essential (primary) hypertension: Secondary | ICD-10-CM | POA: Diagnosis not present

## 2016-11-11 DIAGNOSIS — Z9049 Acquired absence of other specified parts of digestive tract: Secondary | ICD-10-CM | POA: Diagnosis not present

## 2016-11-11 DIAGNOSIS — G250 Essential tremor: Secondary | ICD-10-CM | POA: Diagnosis not present

## 2016-11-11 DIAGNOSIS — R7989 Other specified abnormal findings of blood chemistry: Secondary | ICD-10-CM | POA: Diagnosis not present

## 2016-11-11 DIAGNOSIS — I5181 Takotsubo syndrome: Secondary | ICD-10-CM | POA: Diagnosis not present

## 2016-11-11 DIAGNOSIS — Z905 Acquired absence of kidney: Secondary | ICD-10-CM | POA: Diagnosis not present

## 2016-11-11 DIAGNOSIS — I2510 Atherosclerotic heart disease of native coronary artery without angina pectoris: Secondary | ICD-10-CM | POA: Diagnosis not present

## 2016-11-11 DIAGNOSIS — I219 Acute myocardial infarction, unspecified: Secondary | ICD-10-CM | POA: Diagnosis not present

## 2016-11-11 DIAGNOSIS — E7800 Pure hypercholesterolemia, unspecified: Secondary | ICD-10-CM | POA: Diagnosis not present

## 2016-11-11 DIAGNOSIS — Z79899 Other long term (current) drug therapy: Secondary | ICD-10-CM | POA: Diagnosis not present

## 2016-11-11 DIAGNOSIS — R112 Nausea with vomiting, unspecified: Secondary | ICD-10-CM | POA: Diagnosis not present

## 2016-11-11 DIAGNOSIS — R0789 Other chest pain: Secondary | ICD-10-CM | POA: Diagnosis not present

## 2016-11-11 DIAGNOSIS — R079 Chest pain, unspecified: Secondary | ICD-10-CM | POA: Diagnosis not present

## 2016-11-11 DIAGNOSIS — E785 Hyperlipidemia, unspecified: Secondary | ICD-10-CM | POA: Diagnosis not present

## 2016-11-11 DIAGNOSIS — Z85528 Personal history of other malignant neoplasm of kidney: Secondary | ICD-10-CM | POA: Diagnosis not present

## 2016-11-11 DIAGNOSIS — I200 Unstable angina: Secondary | ICD-10-CM | POA: Diagnosis not present

## 2016-11-11 DIAGNOSIS — E039 Hypothyroidism, unspecified: Secondary | ICD-10-CM | POA: Diagnosis not present

## 2016-11-11 DIAGNOSIS — C649 Malignant neoplasm of unspecified kidney, except renal pelvis: Secondary | ICD-10-CM | POA: Diagnosis not present

## 2016-11-11 DIAGNOSIS — R61 Generalized hyperhidrosis: Secondary | ICD-10-CM | POA: Diagnosis not present

## 2016-11-11 DIAGNOSIS — Z90710 Acquired absence of both cervix and uterus: Secondary | ICD-10-CM | POA: Diagnosis not present

## 2016-11-11 DIAGNOSIS — I214 Non-ST elevation (NSTEMI) myocardial infarction: Secondary | ICD-10-CM | POA: Diagnosis not present

## 2016-11-11 DIAGNOSIS — M1990 Unspecified osteoarthritis, unspecified site: Secondary | ICD-10-CM | POA: Diagnosis not present

## 2016-11-12 DIAGNOSIS — I2510 Atherosclerotic heart disease of native coronary artery without angina pectoris: Secondary | ICD-10-CM | POA: Diagnosis not present

## 2016-11-12 DIAGNOSIS — I200 Unstable angina: Secondary | ICD-10-CM | POA: Diagnosis not present

## 2016-11-12 DIAGNOSIS — I214 Non-ST elevation (NSTEMI) myocardial infarction: Secondary | ICD-10-CM | POA: Diagnosis not present

## 2016-11-12 DIAGNOSIS — I10 Essential (primary) hypertension: Secondary | ICD-10-CM | POA: Diagnosis not present

## 2016-11-12 DIAGNOSIS — E785 Hyperlipidemia, unspecified: Secondary | ICD-10-CM | POA: Diagnosis not present

## 2016-11-12 DIAGNOSIS — Z905 Acquired absence of kidney: Secondary | ICD-10-CM | POA: Diagnosis not present

## 2016-11-12 DIAGNOSIS — I219 Acute myocardial infarction, unspecified: Secondary | ICD-10-CM | POA: Diagnosis not present

## 2016-11-12 DIAGNOSIS — E7800 Pure hypercholesterolemia, unspecified: Secondary | ICD-10-CM | POA: Diagnosis not present

## 2016-11-25 DIAGNOSIS — I10 Essential (primary) hypertension: Secondary | ICD-10-CM | POA: Diagnosis not present

## 2016-11-25 DIAGNOSIS — I249 Acute ischemic heart disease, unspecified: Secondary | ICD-10-CM | POA: Diagnosis not present

## 2016-11-25 DIAGNOSIS — E785 Hyperlipidemia, unspecified: Secondary | ICD-10-CM | POA: Diagnosis not present

## 2017-05-26 DIAGNOSIS — I10 Essential (primary) hypertension: Secondary | ICD-10-CM | POA: Diagnosis not present

## 2017-05-26 DIAGNOSIS — E785 Hyperlipidemia, unspecified: Secondary | ICD-10-CM | POA: Diagnosis not present

## 2017-05-26 DIAGNOSIS — I249 Acute ischemic heart disease, unspecified: Secondary | ICD-10-CM | POA: Diagnosis not present

## 2017-05-26 DIAGNOSIS — Z905 Acquired absence of kidney: Secondary | ICD-10-CM | POA: Diagnosis not present

## 2017-05-26 DIAGNOSIS — K769 Liver disease, unspecified: Secondary | ICD-10-CM | POA: Diagnosis not present

## 2017-06-06 DIAGNOSIS — M4306 Spondylolysis, lumbar region: Secondary | ICD-10-CM | POA: Diagnosis not present

## 2017-06-06 DIAGNOSIS — M160 Bilateral primary osteoarthritis of hip: Secondary | ICD-10-CM | POA: Diagnosis not present

## 2017-06-06 DIAGNOSIS — M461 Sacroiliitis, not elsewhere classified: Secondary | ICD-10-CM | POA: Diagnosis not present

## 2017-06-08 DIAGNOSIS — M461 Sacroiliitis, not elsewhere classified: Secondary | ICD-10-CM | POA: Diagnosis not present

## 2017-06-08 DIAGNOSIS — M4306 Spondylolysis, lumbar region: Secondary | ICD-10-CM | POA: Diagnosis not present

## 2017-06-08 DIAGNOSIS — M160 Bilateral primary osteoarthritis of hip: Secondary | ICD-10-CM | POA: Diagnosis not present

## 2017-06-13 DIAGNOSIS — M4306 Spondylolysis, lumbar region: Secondary | ICD-10-CM | POA: Diagnosis not present

## 2017-06-13 DIAGNOSIS — M160 Bilateral primary osteoarthritis of hip: Secondary | ICD-10-CM | POA: Diagnosis not present

## 2017-06-13 DIAGNOSIS — M461 Sacroiliitis, not elsewhere classified: Secondary | ICD-10-CM | POA: Diagnosis not present

## 2017-06-15 DIAGNOSIS — M461 Sacroiliitis, not elsewhere classified: Secondary | ICD-10-CM | POA: Diagnosis not present

## 2017-06-15 DIAGNOSIS — M160 Bilateral primary osteoarthritis of hip: Secondary | ICD-10-CM | POA: Diagnosis not present

## 2017-06-15 DIAGNOSIS — M4306 Spondylolysis, lumbar region: Secondary | ICD-10-CM | POA: Diagnosis not present

## 2017-06-20 DIAGNOSIS — M160 Bilateral primary osteoarthritis of hip: Secondary | ICD-10-CM | POA: Diagnosis not present

## 2017-06-20 DIAGNOSIS — M461 Sacroiliitis, not elsewhere classified: Secondary | ICD-10-CM | POA: Diagnosis not present

## 2017-06-20 DIAGNOSIS — M4306 Spondylolysis, lumbar region: Secondary | ICD-10-CM | POA: Diagnosis not present

## 2017-06-23 DIAGNOSIS — M160 Bilateral primary osteoarthritis of hip: Secondary | ICD-10-CM | POA: Diagnosis not present

## 2017-06-23 DIAGNOSIS — M461 Sacroiliitis, not elsewhere classified: Secondary | ICD-10-CM | POA: Diagnosis not present

## 2017-06-23 DIAGNOSIS — M4306 Spondylolysis, lumbar region: Secondary | ICD-10-CM | POA: Diagnosis not present

## 2017-06-27 DIAGNOSIS — M461 Sacroiliitis, not elsewhere classified: Secondary | ICD-10-CM | POA: Diagnosis not present

## 2017-06-27 DIAGNOSIS — M160 Bilateral primary osteoarthritis of hip: Secondary | ICD-10-CM | POA: Diagnosis not present

## 2017-06-27 DIAGNOSIS — M4306 Spondylolysis, lumbar region: Secondary | ICD-10-CM | POA: Diagnosis not present

## 2017-07-05 DIAGNOSIS — M461 Sacroiliitis, not elsewhere classified: Secondary | ICD-10-CM | POA: Diagnosis not present

## 2017-07-05 DIAGNOSIS — M160 Bilateral primary osteoarthritis of hip: Secondary | ICD-10-CM | POA: Diagnosis not present

## 2017-07-05 DIAGNOSIS — M4306 Spondylolysis, lumbar region: Secondary | ICD-10-CM | POA: Diagnosis not present

## 2017-08-08 DIAGNOSIS — I249 Acute ischemic heart disease, unspecified: Secondary | ICD-10-CM | POA: Diagnosis not present

## 2017-08-08 DIAGNOSIS — E785 Hyperlipidemia, unspecified: Secondary | ICD-10-CM | POA: Diagnosis not present

## 2017-08-08 DIAGNOSIS — Z01810 Encounter for preprocedural cardiovascular examination: Secondary | ICD-10-CM | POA: Diagnosis not present

## 2017-08-08 DIAGNOSIS — I10 Essential (primary) hypertension: Secondary | ICD-10-CM | POA: Diagnosis not present

## 2017-09-23 DIAGNOSIS — Z79899 Other long term (current) drug therapy: Secondary | ICD-10-CM | POA: Diagnosis not present

## 2017-09-23 DIAGNOSIS — E349 Endocrine disorder, unspecified: Secondary | ICD-10-CM | POA: Diagnosis not present

## 2017-11-28 DIAGNOSIS — R609 Edema, unspecified: Secondary | ICD-10-CM | POA: Diagnosis not present

## 2017-11-28 DIAGNOSIS — I249 Acute ischemic heart disease, unspecified: Secondary | ICD-10-CM | POA: Diagnosis not present

## 2017-11-28 DIAGNOSIS — E785 Hyperlipidemia, unspecified: Secondary | ICD-10-CM | POA: Diagnosis not present

## 2017-11-28 DIAGNOSIS — I10 Essential (primary) hypertension: Secondary | ICD-10-CM | POA: Diagnosis not present

## 2017-11-30 DIAGNOSIS — Z79899 Other long term (current) drug therapy: Secondary | ICD-10-CM | POA: Diagnosis not present

## 2017-11-30 DIAGNOSIS — E349 Endocrine disorder, unspecified: Secondary | ICD-10-CM | POA: Diagnosis not present

## 2018-01-16 DIAGNOSIS — I249 Acute ischemic heart disease, unspecified: Secondary | ICD-10-CM | POA: Diagnosis not present

## 2018-01-16 DIAGNOSIS — E785 Hyperlipidemia, unspecified: Secondary | ICD-10-CM | POA: Diagnosis not present

## 2018-01-16 DIAGNOSIS — I10 Essential (primary) hypertension: Secondary | ICD-10-CM | POA: Diagnosis not present

## 2018-01-16 DIAGNOSIS — R251 Tremor, unspecified: Secondary | ICD-10-CM | POA: Diagnosis not present

## 2018-03-22 DIAGNOSIS — Z1231 Encounter for screening mammogram for malignant neoplasm of breast: Secondary | ICD-10-CM | POA: Diagnosis not present

## 2018-04-07 DIAGNOSIS — M47816 Spondylosis without myelopathy or radiculopathy, lumbar region: Secondary | ICD-10-CM | POA: Diagnosis not present

## 2018-04-17 DIAGNOSIS — R109 Unspecified abdominal pain: Secondary | ICD-10-CM | POA: Diagnosis not present

## 2018-04-17 DIAGNOSIS — R110 Nausea: Secondary | ICD-10-CM | POA: Diagnosis not present

## 2018-04-17 DIAGNOSIS — K5730 Diverticulosis of large intestine without perforation or abscess without bleeding: Secondary | ICD-10-CM | POA: Diagnosis not present

## 2018-04-17 DIAGNOSIS — K219 Gastro-esophageal reflux disease without esophagitis: Secondary | ICD-10-CM | POA: Diagnosis not present

## 2018-04-17 DIAGNOSIS — R1030 Lower abdominal pain, unspecified: Secondary | ICD-10-CM | POA: Diagnosis not present

## 2018-04-17 DIAGNOSIS — E079 Disorder of thyroid, unspecified: Secondary | ICD-10-CM | POA: Diagnosis not present

## 2018-04-17 DIAGNOSIS — Z79890 Hormone replacement therapy (postmenopausal): Secondary | ICD-10-CM | POA: Diagnosis not present

## 2018-04-17 DIAGNOSIS — E785 Hyperlipidemia, unspecified: Secondary | ICD-10-CM | POA: Diagnosis not present

## 2018-04-17 DIAGNOSIS — I10 Essential (primary) hypertension: Secondary | ICD-10-CM | POA: Diagnosis not present

## 2018-07-04 ENCOUNTER — Encounter (INDEPENDENT_AMBULATORY_CARE_PROVIDER_SITE_OTHER): Payer: Self-pay

## 2018-07-11 DIAGNOSIS — I249 Acute ischemic heart disease, unspecified: Secondary | ICD-10-CM | POA: Diagnosis not present

## 2018-07-11 DIAGNOSIS — E785 Hyperlipidemia, unspecified: Secondary | ICD-10-CM | POA: Diagnosis not present

## 2018-07-11 DIAGNOSIS — I10 Essential (primary) hypertension: Secondary | ICD-10-CM | POA: Diagnosis not present

## 2018-07-11 DIAGNOSIS — R251 Tremor, unspecified: Secondary | ICD-10-CM | POA: Diagnosis not present

## 2018-07-12 ENCOUNTER — Ambulatory Visit (INDEPENDENT_AMBULATORY_CARE_PROVIDER_SITE_OTHER): Payer: Medicare Other | Admitting: Bariatrics

## 2018-07-12 ENCOUNTER — Encounter (INDEPENDENT_AMBULATORY_CARE_PROVIDER_SITE_OTHER): Payer: Self-pay | Admitting: Bariatrics

## 2018-07-12 VITALS — BP 93/65 | HR 61 | Temp 97.9°F | Ht 63.0 in | Wt 195.0 lb

## 2018-07-12 DIAGNOSIS — E559 Vitamin D deficiency, unspecified: Secondary | ICD-10-CM

## 2018-07-12 DIAGNOSIS — R7309 Other abnormal glucose: Secondary | ICD-10-CM | POA: Diagnosis not present

## 2018-07-12 DIAGNOSIS — R5383 Other fatigue: Secondary | ICD-10-CM

## 2018-07-12 DIAGNOSIS — I252 Old myocardial infarction: Secondary | ICD-10-CM | POA: Diagnosis not present

## 2018-07-12 DIAGNOSIS — E7849 Other hyperlipidemia: Secondary | ICD-10-CM | POA: Diagnosis not present

## 2018-07-12 DIAGNOSIS — I10 Essential (primary) hypertension: Secondary | ICD-10-CM | POA: Diagnosis not present

## 2018-07-12 DIAGNOSIS — Z1331 Encounter for screening for depression: Secondary | ICD-10-CM

## 2018-07-12 DIAGNOSIS — Z6834 Body mass index (BMI) 34.0-34.9, adult: Secondary | ICD-10-CM | POA: Diagnosis not present

## 2018-07-12 DIAGNOSIS — E538 Deficiency of other specified B group vitamins: Secondary | ICD-10-CM | POA: Diagnosis not present

## 2018-07-12 DIAGNOSIS — E669 Obesity, unspecified: Secondary | ICD-10-CM | POA: Diagnosis not present

## 2018-07-12 DIAGNOSIS — Z0289 Encounter for other administrative examinations: Secondary | ICD-10-CM

## 2018-07-12 DIAGNOSIS — R0602 Shortness of breath: Secondary | ICD-10-CM | POA: Diagnosis not present

## 2018-07-13 LAB — VITAMIN D 25 HYDROXY (VIT D DEFICIENCY, FRACTURES): VIT D 25 HYDROXY: 48.8 ng/mL (ref 30.0–100.0)

## 2018-07-13 LAB — T4, FREE: Free T4: 1.31 ng/dL (ref 0.82–1.77)

## 2018-07-13 LAB — LIPID PANEL WITH LDL/HDL RATIO
Cholesterol, Total: 283 mg/dL — ABNORMAL HIGH (ref 100–199)
HDL: 50 mg/dL (ref >39)
LDL CALC: 203 mg/dL — AB (ref 0–99)
LDl/HDL Ratio: 4.1 ratio — ABNORMAL HIGH (ref 0.0–3.2)
Triglycerides: 151 mg/dL — ABNORMAL HIGH (ref 0–149)
VLDL CHOLESTEROL CAL: 30 mg/dL (ref 5–40)

## 2018-07-13 LAB — COMPREHENSIVE METABOLIC PANEL
A/G RATIO: 2.1 (ref 1.2–2.2)
ALBUMIN: 4.4 g/dL (ref 3.5–4.8)
ALK PHOS: 40 IU/L (ref 39–117)
ALT: 11 IU/L (ref 0–32)
AST: 13 IU/L (ref 0–40)
BUN/Creatinine Ratio: 24 (ref 12–28)
BUN: 25 mg/dL (ref 8–27)
Bilirubin Total: 0.7 mg/dL (ref 0.0–1.2)
CO2: 25 mmol/L (ref 20–29)
CREATININE: 1.04 mg/dL — AB (ref 0.57–1.00)
Calcium: 9.1 mg/dL (ref 8.7–10.3)
Chloride: 101 mmol/L (ref 96–106)
GFR calc Af Amer: 62 mL/min/{1.73_m2} (ref >59)
GFR, EST NON AFRICAN AMERICAN: 54 mL/min/{1.73_m2} — AB (ref >59)
GLOBULIN, TOTAL: 2.1 g/dL (ref 1.5–4.5)
Glucose: 92 mg/dL (ref 65–99)
POTASSIUM: 4.3 mmol/L (ref 3.5–5.2)
SODIUM: 142 mmol/L (ref 134–144)
Total Protein: 6.5 g/dL (ref 6.0–8.5)

## 2018-07-13 LAB — HEMOGLOBIN A1C
ESTIMATED AVERAGE GLUCOSE: 105 mg/dL
HEMOGLOBIN A1C: 5.3 % (ref 4.8–5.6)

## 2018-07-13 LAB — T3: T3, Total: 79 ng/dL (ref 71–180)

## 2018-07-13 LAB — TSH: TSH: 3.38 u[IU]/mL (ref 0.450–4.500)

## 2018-07-13 LAB — VITAMIN B12: Vitamin B-12: >2000 pg/mL — ABNORMAL HIGH (ref 232–1245)

## 2018-07-13 LAB — INSULIN, RANDOM: INSULIN: 5.8 u[IU]/mL (ref 2.6–24.9)

## 2018-07-16 ENCOUNTER — Encounter (INDEPENDENT_AMBULATORY_CARE_PROVIDER_SITE_OTHER): Payer: Self-pay | Admitting: Bariatrics

## 2018-07-18 DIAGNOSIS — Z6832 Body mass index (BMI) 32.0-32.9, adult: Secondary | ICD-10-CM

## 2018-07-18 DIAGNOSIS — E669 Obesity, unspecified: Secondary | ICD-10-CM | POA: Insufficient documentation

## 2018-07-18 NOTE — Progress Notes (Signed)
.  Office: [phone]  /  Fax: [phone]   HPI:   Chief Complaint: OBESITY  Brianna Wu (MR# [id number]) is a 72 y.o. female who presents on 07/18/2018 for obesity evaluation and treatment. Current BMI is Body mass index is 34.54 kg/m.Brianna Wu has struggled with obesity for years and has been unsuccessful in either losing weight or maintaining long term weight loss. Brianna Wu has had no increase in appetite.  Brianna Wu attended our information session and states she is currently in the action stage of change and ready to dedicate time achieving and maintaining a healthier weight.  Brianna Wu states her family eats meals together she thinks her family will eat healthier with  her her desired weight loss is 50 lbs she has been heavy most of  her life she started gaining weight after the birth of her last child her heaviest weight ever was 225 lbs. she has significant food cravings for carbohydrates and sweets (late afternoon and evening) she snacks frequently in the evenings she skips meals frequently she is frequently drinking liquids with calories  History of "stomach stapling" at [facility] [phone]). Her highest weight was at 225 pounds.   Fatigue Brianna Wu feels her energy is lower than it should be. This has worsened with weight gain and has not worsened recently. Brianna Wu admits to daytime somnolence and admits to waking up still tired. Patient is at risk for obstructive sleep apnea. Patent has a history of symptoms of daytime fatigue, morning fatigue and hypertension. Patient generally gets 7 or 8 hours of sleep per night, and she wakes up st night, secondary to pain. Snoring is present. Apneic episodes are not present. Epworth Sleepiness Score is 6  Dyspnea on exertion Brianna Wu notes increasing shortness of breath with certain activities, such as climbing stairs and fast walking, and seems to be worsening over time with weight gain. She notes getting out of breath sooner with activity than she used  to. This has not gotten worse recently. Brianna Wu denies orthopnea.  Hypertension Brianna Wu is a 72 y.o. female with hypertension. She is taking HCTZ, Amlodipine, Lisinopril and Propanolol. Brianna Wu denies lightheadedness. She is working weight loss to help control her blood pressure with the goal of decreasing her risk of heart attack and stroke. [name] blood pressure is well controlled.  Hyperlipidemia Brianna Wu has hyperlipidemia and he is currently taking statin. Her hyperlipidemia is well controlled, per patient. She is attempting to  improve her cholesterol levels with intensive lifestyle modification including a low saturated fat diet, exercise and weight loss. She denies myalgias.  Vitamin D deficiency Brianna Wu has a diagnosis of vitamin D deficiency. She is currently taking OTC vit D and denies nausea, vomiting or muscle weakness.  B12 deficiency Brianna Wu has a diagnosis of B12 insufficiency and notes fatigue. [name] is not a vegetarian and does not have a previous diagnosis of pernicious anemia. She has a history of weight loss surgery.   Elevated Glucose Brianna Wu has a history of some elevated blood glucose readings without a diagnosis of diabetes. She denies polyphagia.  History of NSTEMI Myocardial Infarction Brianna Wu has a history of NSTEMI myocardial infarction and she states there is no damage. She sees her cardiologist every six months.  Depression Screen Brianna Wu (modified PHQ-9) score was  Depression screen PHQ 2/9 07/12/2018  Decreased Interest 3  Down, Depressed, Hopeless 1  PHQ - 2 Score 4  Altered sleeping 0  Tired, decreased energy 2  Change in appetite 0  Feeling bad or failure about yourself  0  Trouble concentrating 0  Moving slowly or fidgety/restless 0  Suicidal thoughts 0  PHQ-9 Score 6  Difficult doing work/chores Not difficult at all    ALLERGIES: Allergies  Allergen Reactions  . Honey Bee Treatment [Bee Venom]   . Oxycodone-Acetaminophen   .  Percocet [Oxycodone-Acetaminophen]     MEDICATIONS: Current Outpatient Medications on File Prior to Visit  Medication Sig Dispense Refill  . aspirin 81 MG tablet Take 81 mg by mouth daily.      . cholecalciferol (VITAMIN D) 1000 UNITS tablet Take 1,000 Units by mouth 5 (five) times daily.    . fish oil-omega-3 fatty acids 1000 MG capsule Take 2 g by mouth daily.    Brianna Kitchen levothyroxine (SYNTHROID, LEVOTHROID) 88 MCG tablet Take 88 mcg by mouth daily.    . magnesium gluconate (MAGONATE) 500 MG tablet Take 400 mg by mouth 4 (four) times daily.    . Multiple Vitamin (MULTIVITAMIN) tablet Take 1 tablet by mouth daily.    . progesterone (PROMETRIUM) 100 MG capsule Take 100 mg by mouth daily.    Brianna Kitchen pyridOXINE (VITAMIN B-6) 100 MG tablet Take 100 mg by mouth daily.    . ramipril (ALTACE) 5 MG capsule Take 5 mg by mouth daily.      . Red Yeast Rice 600 MG CAPS Take by mouth daily.    . vitamin C (ASCORBIC ACID) 500 MG tablet Take 500 mg by mouth daily.    . Zinc Gluconate 30 MG TABS Take 1 tablet by mouth daily.     No current facility-administered medications on file prior to visit.     PAST MEDICAL HISTORY: Past Medical History:  Diagnosis Date  . Aortic insufficiency   . Arthritis   . Cancer ([place])   . Constipation   . Depressed   . GERD (gastroesophageal reflux disease)   . Heart disease    vvalvular with dilated aortic root 4.2cm (echo 10-09)  . History of heart attack   . HLD (hyperlipidemia)   . HTN (hypertension)   . Hypothyroidism   . Pernicious anemia   . Renal cell carcinoma 2004   L stage I  . Scoliosis   . Seasonal asthma   . Stomach ulcer   . Upper abdominal pain     PAST SURGICAL HISTORY: Past Surgical History:  Procedure Laterality Date  . arthroscopic knee     x2  . bilat cataract surgery     Dr. Kirby Funk  . CESAREAN SECTION    . CHOLECYSTECTOMY  1974  . HYSTERECTOMY ABDOMINAL WITH SALPINGECTOMY    . INGUINAL HERNIA REPAIR  1970  . l nephrectomy for stage RCC   2005  . ltcs     x2  . SKIN CANCER EXCISION    . stomach stapling    . TONSILLECTOMY  1950  . TOTAL ABDOMINAL HYSTERECTOMY  1976    SOCIAL HISTORY: Social History   Tobacco Use  . Smoking status: Never Smoker  . Smokeless tobacco: Never Used  Substance Use Topics  . Alcohol use: Not on file  . Drug use: Not on file    FAMILY HISTORY: Family History  Problem Relation Age of Onset  . Breast cancer Mother   . Lung cancer Mother   . Depression Mother   . Hyperlipidemia Mother   . High blood pressure Mother   . Lung cancer Father   . Heart disease Father   . Lung cancer Brother  ROS: Review of Systems  Constitutional: Positive for malaise/fatigue.       + Breasts Lumps  HENT: Positive for tinnitus.   Eyes:       + Wear Glasses or Contacts  Respiratory: Positive for shortness of breath and wheezing.   Cardiovascular: Negative for orthopnea.       + Shortness of Breath with Activity  Gastrointestinal: Negative for nausea and vomiting.  Musculoskeletal: Negative for myalgias.       Negative for muscle weakness  Skin:       + Dryness  Neurological: Positive for tremors.       Negative for lightheadedness  Endo/Heme/Allergies:       Negative for polyphagia Positive for cravings    PHYSICAL EXAM: Blood pressure 93/65, pulse 61, temperature 97.9 F (36.6 C), height 5\' 3"  (1.6 m), weight 195 lb (88.5 kg), SpO2 96 %. Body mass index is 34.54 kg/m. Physical Exam  Constitutional: She is oriented to person, place, and time. She appears well-developed and well-nourished.  HENT:  Head: Normocephalic and atraumatic.  Nose: Nose normal.  Mallanpati = 3  Eyes: EOM are normal. No scleral icterus.  Neck: Normal range of motion. Neck supple. No thyromegaly present.  Cardiovascular: Normal rate and regular rhythm.  Pulmonary/Chest: Effort normal. No respiratory distress.  Abdominal: Soft. There is no tenderness.  + Obesity  Musculoskeletal: Normal range of motion.  She exhibits edema (trace edema bilateral lower extremities).  Range of Motion normal in all 4 extremities  Neurological: She is alert and oriented to person, place, and time. Coordination normal.  Skin: Skin is warm and dry.  Psychiatric: She has a normal Wu and affect.  Vitals reviewed.   RECENT LABS AND TESTS: BMET    Component Value Date/Time   NA 142 07/12/2018 1111   K 4.3 07/12/2018 1111   CL 101 07/12/2018 1111   CO2 25 07/12/2018 1111   GLUCOSE 92 07/12/2018 1111   GLUCOSE 89 08/19/2010 1514   BUN 25 07/12/2018 1111   CREATININE 1.04 (H) 07/12/2018 1111   CALCIUM 9.1 07/12/2018 1111   GFRNONAA 54 (L) 07/12/2018 1111   GFRAA 62 07/12/2018 1111   Lab Results  Component Value Date   HGBA1C 5.3 07/12/2018   Lab Results  Component Value Date   INSULIN 5.8 07/12/2018   CBC    Component Value Date/Time   WBC 6.4 09/17/2009 0126   RBC 4.68 09/17/2009 0126   HGB 14.0 09/17/2009 0126   HCT 43.0 09/17/2009 0126   PLT 242 09/17/2009 0126   MCV 91.9 09/17/2009 0126   MCHC 32.6 09/17/2009 0126   RDW 13.3 09/17/2009 0126   LYMPHSABS 2.1 09/17/2009 0126   MONOABS 0.5 09/17/2009 0126   EOSABS 0.2 09/17/2009 0126   BASOSABS 0.1 09/17/2009 0126   Iron/TIBC/Ferritin/ %Sat    Component Value Date/Time   IRON 86 09/17/2009 0126   TIBC 320 09/17/2009 0126   FERRITIN 28 03/27/2008 2053   IRONPCTSAT 27 09/17/2009 0126   Lipid Panel     Component Value Date/Time   CHOL 283 (H) 07/12/2018 1111   TRIG 151 (H) 07/12/2018 1111   HDL 50 07/12/2018 1111   CHOLHDL 2.7 Ratio 04/11/2009 2004   VLDL 13 04/11/2009 2004   LDLCALC 203 (H) 07/12/2018 1111   LDLDIRECT 112 (H) 01/10/2009 2318   Hepatic Function Panel     Component Value Date/Time   PROT 6.5 07/12/2018 1111   ALBUMIN 4.4 07/12/2018 1111   AST 13 07/12/2018 1111  ALT 11 07/12/2018 1111   ALKPHOS 40 07/12/2018 1111   BILITOT 0.7 07/12/2018 1111      Component Value Date/Time   TSH 3.380 07/12/2018 1111     Vitamin D Results for TEIGHLOR, KORSON (MRN [id number]) as of 07/18/2018 08:25  Ref. Range 09/17/2009 01:26  Vitamin D, 25-Hydroxy Latest Ref Range: 30 - 89 ng/mL 54  *  INDIRECT CALORIMETER done today shows a VO2 of 222 and a REE of 1547. Her calculated basal metabolic rate is 4098 thus her basal metabolic rate is better than expected.    ASSESSMENT AND PLAN: Other fatigue - Plan: EKG 12-Lead, T3, T4, free, TSH  Shortness of breath on exertion  Essential hypertension  Vitamin D deficiency - Plan: VITAMIN D 25 Hydroxy (Vit-D Deficiency, Fractures)  Other hyperlipidemia - Plan: Lipid Panel With LDL/HDL Ratio  B12 nutritional deficiency - Plan: Vitamin B12  Elevated glucose - Plan: Comprehensive metabolic panel, Hemoglobin A1c, Insulin, random  History of non-ST elevation myocardial infarction (NSTEMI)  Depression screening  Class 1 obesity with serious comorbidity and body mass index (BMI) of 34.0 to 34.9 in adult, unspecified obesity type  PLAN:  Fatigue Annita was informed that her fatigue may be related to obesity, depression or many other causes. Labs will be ordered, and in the meanwhile Chynna has agreed to work on diet, exercise and weight loss to help with fatigue. Proper sleep hygiene was discussed including the need for 7-8 hours of quality sleep each night. A sleep study was not ordered based on symptoms and Epworth score.  Dyspnea on exertion Khalis's shortness of breath appears to be obesity related and exercise induced. She has agreed to work on weight loss and slowly increase activity over time to treat her exercise induced shortness of breath. If Jaymie follows our instructions and loses weight without improvement of her shortness of breath, we will plan to refer to pulmonology. We will monitor this condition regularly. Yunuen agrees to this plan.  Hypertension We discussed sodium restriction, working on healthy weight loss, and a regular exercise program as the means  to achieve improved blood pressure control. Soleil agreed with this plan and agreed to follow up as directed. We will continue to monitor her blood pressure as well as her progress with the above lifestyle modifications. She will continue her medications as prescribed and will watch for signs of hypotension as she continues her lifestyle modifications.  Hyperlipidemia Aleah was informed of the American Heart Association Guidelines emphasizing intensive lifestyle modifications as the first line treatment for hyperlipidemia. We discussed many lifestyle modifications today in depth, and Alantis will start to work on decreasing saturated fats such as fatty red meat, butter and many fried foods. She will also increase vegetables and lean protein in her diet and start to work on exercise and weight loss efforts. Shakora will continue her medications as prescribed and follow up as directed.  Vitamin D Deficiency Janace was informed that low vitamin D levels contributes to fatigue and are associated with obesity, breast, and colon cancer. She will continue to take OTC Vit D and will follow up for routine testing of vitamin D, at least 2-3 times per year. She was informed of the risk of over-replacement of vitamin D and agrees to not increase her dose unless she discusses this with [name] first. We will check vitamin D level and Aracelie will follow up as directed.  B12 deficiency Yanai will work on increasing B12 rich foods in her diet. B12 supplementation  was not prescribed today. We will check B12 levels and Japneet will follow up at the agreed upon time.  Elevated Glucose We will check Hgb A1c and fasting insulin level and results with be discussed with Audree Camel in 2 weeks at her follow up visit. In the meanwhile Fred was started on a lower simple carbohydrate diet and will work on weight loss efforts.  History of NSTEMI Myocardial Infarction Briante will continue to see her cardiologist every six months. She will start to work on diet  and weight loss and will follow up with our clinic at the agreed upon time.  Depression Screen Frady had a mildly positive depression screening. Depression is commonly associated with obesity and often results in emotional eating behaviors. We will monitor this closely and work on CBT to help improve the non-hunger eating patterns. Referral to Psychology may be required if no improvement is seen as she continues in our clinic.  Obesity Moneka is currently in the action stage of change and her goal is to continue with weight loss efforts She has agreed to follow the Category 2 plan Kimmi has been instructed to work up to a goal of 150 minutes of combined cardio and strengthening exercise per week for weight loss and overall health benefits. We discussed the following Behavioral Modification Strategies today: increase H2O intake, no skipping meals, increasing lean protein intake, decreasing simple carbohydrates , increasing vegetables and work on meal planning and easy cooking plans  Quetzaly has agreed to follow up with our clinic in 2 weeks. She was informed of the importance of frequent follow up visits to maximize her success with intensive lifestyle modifications for her multiple health conditions. She was informed we would discuss her lab results at her next visit unless there is a critical issue that needs to be addressed sooner. Bobetta agreed to keep her next visit at the agreed upon time to discuss these results.    OBESITY BEHAVIORAL INTERVENTION VISIT  Today's visit was # 1   Starting weight: 195 lbs Starting date: 07/12/18 Today's weight : 195 lbs  Today's date: 07/12/2018 Total lbs lost to date: 0 At least 15 minutes were spent on discussing the following behavioral intervention visit.   ASK: We discussed the diagnosis of obesity with Corena Pilgrim today and Wu agreed to give [name] permission to discuss obesity behavioral modification therapy today.  ASSESS: Cadence has the diagnosis of  obesity and her BMI today is 34.55 Daniela is in the action stage of change   ADVISE: Shanetha was educated on the multiple health risks of obesity as well as the benefit of weight loss to improve her health. She was advised of the need for long term treatment and the importance of lifestyle modifications to improve her current health and to decrease her risk of future health problems.  AGREE: Multiple dietary modification options and treatment options were discussed and  Shanyah agreed to follow the recommendations documented in the above note.  ARRANGE: Oluwademilade was educated on the importance of frequent visits to treat obesity as outlined per CMS and USPSTF guidelines and agreed to schedule her next follow up appointment today.   Corey Skains, am acting as Location manager for [employer]. Owens Shark, DO  I have reviewed the above documentation for accuracy and completeness, and I agree with the above. -Jearld Lesch, DO

## 2018-07-24 DIAGNOSIS — I361 Nonrheumatic tricuspid (valve) insufficiency: Secondary | ICD-10-CM | POA: Diagnosis not present

## 2018-07-24 DIAGNOSIS — I340 Nonrheumatic mitral (valve) insufficiency: Secondary | ICD-10-CM | POA: Diagnosis not present

## 2018-07-24 DIAGNOSIS — I517 Cardiomegaly: Secondary | ICD-10-CM | POA: Diagnosis not present

## 2018-07-26 ENCOUNTER — Encounter (INDEPENDENT_AMBULATORY_CARE_PROVIDER_SITE_OTHER): Payer: Self-pay | Admitting: Bariatrics

## 2018-07-26 ENCOUNTER — Ambulatory Visit (INDEPENDENT_AMBULATORY_CARE_PROVIDER_SITE_OTHER): Payer: Medicare Other | Admitting: Bariatrics

## 2018-07-26 VITALS — BP 110/66 | HR 48 | Temp 97.7°F | Ht 63.0 in | Wt 189.0 lb

## 2018-07-26 DIAGNOSIS — I10 Essential (primary) hypertension: Secondary | ICD-10-CM

## 2018-07-26 DIAGNOSIS — Z6833 Body mass index (BMI) 33.0-33.9, adult: Secondary | ICD-10-CM | POA: Diagnosis not present

## 2018-07-26 DIAGNOSIS — E669 Obesity, unspecified: Secondary | ICD-10-CM | POA: Diagnosis not present

## 2018-07-26 DIAGNOSIS — I252 Old myocardial infarction: Secondary | ICD-10-CM | POA: Diagnosis not present

## 2018-07-26 DIAGNOSIS — E7849 Other hyperlipidemia: Secondary | ICD-10-CM | POA: Diagnosis not present

## 2018-07-27 DIAGNOSIS — I252 Old myocardial infarction: Secondary | ICD-10-CM | POA: Insufficient documentation

## 2018-07-27 NOTE — Progress Notes (Signed)
Office: [phone]  /  Fax: [phone]   HPI:   Chief Complaint: OBESITY Brianna Wu is here to discuss her progress with her obesity treatment plan. She is following the Category 2 plan and is following her eating plan approximately 95 % of the time. She states she is exercising 0 minutes 0 times per week. Brianna Wu denies any problems with the plan. She denies hunger and no unusual cravings.  Her weight is 189 lb (85.7 kg) today and has had a weight loss of 6 pounds over a period of 2 weeks since her last visit. She has lost 6 lbs since starting treatment with [name].  Hyperlipidemia Brianna Wu has hyperlipidemia and has been trying to improve her cholesterol levels with intensive lifestyle modification including a low saturated fat diet, exercise and weight loss. Brianna Wu is taking Rosuvastatin 40 mg qd, which she has not taken for a few weeks. She denies any chest pain, claudication or myalgias.  Hypertension Brianna Wu is a 72 y.o. female with hypertension.  Brianna Wu denies chest pain or shortness of breath on exertion. She is working weight loss to help control her blood pressure with the goal of decreasing her risk of heart attack and stroke. Brianna Wu blood pressure is currently well controlled on Altace. She denies any side effects.   History of Non Stemi MI Brianna Wu agrees to resume her statin medication. She denies any chest pain and no shortness of breath.   ALLERGIES: Allergies  Allergen Reactions  . Honey Bee Treatment [Bee Venom]   . Oxycodone-Acetaminophen   . Percocet [Oxycodone-Acetaminophen]     MEDICATIONS: Current Outpatient Medications on File Prior to Visit  Medication Sig Dispense Refill  . aspirin 81 MG tablet Take 81 mg by mouth daily.      . cholecalciferol (VITAMIN D) 1000 UNITS tablet Take 1,000 Units by mouth 5 (five) times daily.    Brianna Wu Oil 500 MG CAPS Take 500 mg by mouth. Take two tabs at night    . levothyroxine (SYNTHROID, LEVOTHROID) 88 MCG tablet Take 88 mcg  by mouth daily.    . magnesium gluconate (MAGONATE) 500 MG tablet Take 400 mg by mouth 4 (four) times daily.    . Multiple Vitamin (MULTIVITAMIN) tablet Take 1 tablet by mouth daily.    . progesterone (PROMETRIUM) 100 MG capsule Take 100 mg by mouth daily.    Marland Kitchen pyridOXINE (VITAMIN B-6) 100 MG tablet Take 100 mg by mouth daily.    . ramipril (ALTACE) 5 MG capsule Take 5 mg by mouth daily.      . rosuvastatin (CRESTOR) 10 MG tablet Take 10 mg by mouth daily.    . vitamin C (ASCORBIC ACID) 500 MG tablet Take 500 mg by mouth daily.    . Zinc Gluconate 30 MG TABS Take 1 tablet by mouth daily.     No current facility-administered medications on file prior to visit.     PAST MEDICAL HISTORY: Past Medical History:  Diagnosis Date  . Aortic insufficiency   . Arthritis   . Cancer ([place])   . Constipation   . Depressed   . GERD (gastroesophageal reflux disease)   . Heart disease    vvalvular with dilated aortic root 4.2cm (echo 10-09)  . History of heart attack   . HLD (hyperlipidemia)   . HTN (hypertension)   . Hypothyroidism   . Pernicious anemia   . Renal cell carcinoma 2004   L stage I  . Scoliosis   . Seasonal  asthma   . Stomach ulcer   . Upper abdominal pain     PAST SURGICAL HISTORY: Past Surgical History:  Procedure Laterality Date  . arthroscopic knee     x2  . bilat cataract surgery     Dr. Kirby Funk  . CESAREAN SECTION    . CHOLECYSTECTOMY  1974  . HYSTERECTOMY ABDOMINAL WITH SALPINGECTOMY    . INGUINAL HERNIA REPAIR  1970  . l nephrectomy for stage RCC  2005  . ltcs     x2  . SKIN CANCER EXCISION    . stomach stapling    . TONSILLECTOMY  1950  . TOTAL ABDOMINAL HYSTERECTOMY  1976    SOCIAL HISTORY: Social History   Tobacco Use  . Smoking status: Never Smoker  . Smokeless tobacco: Never Used  Substance Use Topics  . Alcohol use: Not on file  . Drug use: Not on file    FAMILY HISTORY: Family History  Problem Relation Age of Onset  . Breast cancer  Mother   . Lung cancer Mother   . Depression Mother   . Hyperlipidemia Mother   . High blood pressure Mother   . Lung cancer Father   . Heart disease Father   . Lung cancer Brother     ROS: Review of Systems  Constitutional: Positive for malaise/fatigue and weight loss.  Respiratory: Negative for shortness of breath.   Cardiovascular: Negative for chest pain and claudication.  Musculoskeletal: Negative for myalgias.    PHYSICAL EXAM: Blood pressure 110/66, pulse (!) 48, temperature 97.7 F (36.5 C), temperature source Oral, height 5\' 3"  (1.6 m), weight 189 lb (85.7 kg), SpO2 93 %. Body mass index is 33.48 kg/m. Physical Exam  Constitutional: She is oriented to person, place, and time. She appears well-developed and well-nourished.  Cardiovascular: Normal rate.  Pulmonary/Chest: Effort normal.  Musculoskeletal: Normal range of motion.  Neurological: She is alert and oriented to person, place, and time.  Skin: Skin is warm and dry.  Psychiatric: She has a normal mood and affect. Her behavior is normal.  Vitals reviewed.   RECENT LABS AND TESTS: BMET    Component Value Date/Time   NA 142 07/12/2018 1111   K 4.3 07/12/2018 1111   CL 101 07/12/2018 1111   CO2 25 07/12/2018 1111   GLUCOSE 92 07/12/2018 1111   GLUCOSE 89 08/19/2010 1514   BUN 25 07/12/2018 1111   CREATININE 1.04 (H) 07/12/2018 1111   CALCIUM 9.1 07/12/2018 1111   GFRNONAA 54 (L) 07/12/2018 1111   GFRAA 62 07/12/2018 1111   Lab Results  Component Value Date   HGBA1C 5.3 07/12/2018   Lab Results  Component Value Date   INSULIN 5.8 07/12/2018   CBC    Component Value Date/Time   WBC 6.4 09/17/2009 0126   RBC 4.68 09/17/2009 0126   HGB 14.0 09/17/2009 0126   HCT 43.0 09/17/2009 0126   PLT 242 09/17/2009 0126   MCV 91.9 09/17/2009 0126   MCHC 32.6 09/17/2009 0126   RDW 13.3 09/17/2009 0126   LYMPHSABS 2.1 09/17/2009 0126   MONOABS 0.5 09/17/2009 0126   EOSABS 0.2 09/17/2009 0126   BASOSABS  0.1 09/17/2009 0126   Iron/TIBC/Ferritin/ %Sat    Component Value Date/Time   IRON 86 09/17/2009 0126   TIBC 320 09/17/2009 0126   FERRITIN 28 03/27/2008 2053   IRONPCTSAT 27 09/17/2009 0126   Lipid Panel     Component Value Date/Time   CHOL 283 (H) 07/12/2018 1111   TRIG  151 (H) 07/12/2018 1111   HDL 50 07/12/2018 1111   CHOLHDL 2.7 Ratio 04/11/2009 2004   VLDL 13 04/11/2009 2004   LDLCALC 203 (H) 07/12/2018 1111   LDLDIRECT 112 (H) 01/10/2009 2318   Hepatic Function Panel     Component Value Date/Time   PROT 6.5 07/12/2018 1111   ALBUMIN 4.4 07/12/2018 1111   AST 13 07/12/2018 1111   ALT 11 07/12/2018 1111   ALKPHOS 40 07/12/2018 1111   BILITOT 0.7 07/12/2018 1111      Component Value Date/Time   TSH 3.380 07/12/2018 1111   TSH 1.360 11/28/2009 1458   TSH 2.957 09/17/2009 0126  Results for MALIHA, OUTTEN (MRN [id number]) as of 07/27/2018 10:46  Ref. Range 07/12/2018 11:11  Vitamin D, 25-Hydroxy Latest Ref Range: 30.0 - 100.0 ng/mL 48.8    ASSESSMENT AND PLAN: Other hyperlipidemia  Essential hypertension  History of non-ST elevation myocardial infarction (NSTEMI)  Class 1 obesity with serious comorbidity and body mass index (BMI) of 33.0 to 33.9 in adult, unspecified obesity type  PLAN: Hyperlipidemia Brianna Wu was informed of the American Heart Association Guidelines emphasizing intensive lifestyle modifications as the first line treatment for hyperlipidemia. We discussed many lifestyle modifications today in depth, and Brianna Wu will continue to work on decreasing saturated fats such as fatty red meat, butter and many fried foods. She will also increase vegetables and lean protein in her diet and continue to work on exercise and weight loss efforts. Brianna Wu agrees to resume the Rosuvastatin for her hyperlipidemia. Brianna Wu agrees to follow up with our office in 2 weeks.   Hypertension We discussed sodium restriction, working on healthy weight loss, and a regular exercise  program as the means to achieve improved blood pressure control. Brianna Wu agreed with this plan and agreed to follow up as directed. We will continue to monitor her blood pressure as well as her progress with the above lifestyle modifications. She will continue her Altace as prescribed and will watch for signs of hypotension as she continues her lifestyle modifications. Brianna Wu agrees to follow up with our office in 2 weeks.   History of Non Stemi MI Discussed with Brianna Wu the risk factors associated with non stemi MI and the need for her to continue her statin medication. She agrees to resume along with work on controlling her HTN. Brianna Wu agrees to follow up in 2 weeks.   Obesity Brianna Wu is currently in the action stage of change. As such, her goal is to continue with weight loss efforts She has agreed to follow the Category 2 plan Brianna Wu has been instructed to work up to a goal of 150 minutes of combined cardio and strengthening exercise per week for weight loss and overall health benefits. We discussed the following Behavioral Modification Strategies today: increasing lean protein intake, increase H2O, decreasing simple carbohydrates , increasing vegetables and work on meal planning and easy cooking plans  Jewelz has agreed to follow up with our clinic in 2 weeks. She was informed of the importance of frequent follow up visits to maximize her success with intensive lifestyle modifications for her multiple health conditions.   OBESITY BEHAVIORAL INTERVENTION VISIT  Today's visit was # 2   Starting weight: 195 Starting date: 07/12/2018 Today's weight : Weight: 189 lb (85.7 kg)  Today's date: 07/26/2018 Total lbs lost to date: 6 lbs At least 15 minutes were spent on discussing the following behavioral intervention visit.   ASK: We discussed the diagnosis of obesity with Corena Pilgrim today and  Satonya agreed to give [name] permission to discuss obesity behavioral modification therapy today.  ASSESS: [name] has  the diagnosis of obesity and her BMI today is 33.49 Simcha is in the action stage of change   ADVISE: Bea was educated on the multiple health risks of obesity as well as the benefit of weight loss to improve her health. She was advised of the need for long term treatment and the importance of lifestyle modifications to improve her current health and to decrease her risk of future health problems.  AGREE: Multiple dietary modification options and treatment options were discussed and  Shanara agreed to follow the recommendations documented in the above note.  ARRANGE: Elenore was educated on the importance of frequent visits to treat obesity as outlined per CMS and USPSTF guidelines and agreed to schedule her next follow up appointment today.  I, Remi Deter, CMA, am acting as Location manager for [employer], DO.   I have reviewed the above documentation for accuracy and completeness, and I agree with the above. -Jearld Lesch, DO

## 2018-08-07 ENCOUNTER — Ambulatory Visit (INDEPENDENT_AMBULATORY_CARE_PROVIDER_SITE_OTHER): Payer: Medicare Other | Admitting: Bariatrics

## 2018-08-07 ENCOUNTER — Encounter (INDEPENDENT_AMBULATORY_CARE_PROVIDER_SITE_OTHER): Payer: Self-pay | Admitting: Bariatrics

## 2018-08-07 VITALS — BP 94/63 | HR 52 | Temp 97.0°F | Ht 63.0 in | Wt 189.0 lb

## 2018-08-07 DIAGNOSIS — E669 Obesity, unspecified: Secondary | ICD-10-CM | POA: Diagnosis not present

## 2018-08-07 DIAGNOSIS — I10 Essential (primary) hypertension: Secondary | ICD-10-CM

## 2018-08-07 DIAGNOSIS — Z6833 Body mass index (BMI) 33.0-33.9, adult: Secondary | ICD-10-CM | POA: Diagnosis not present

## 2018-08-07 DIAGNOSIS — E7849 Other hyperlipidemia: Secondary | ICD-10-CM

## 2018-08-09 ENCOUNTER — Telehealth (INDEPENDENT_AMBULATORY_CARE_PROVIDER_SITE_OTHER): Payer: Self-pay | Admitting: Bariatrics

## 2018-08-09 NOTE — Telephone Encounter (Signed)
Dr. Owens Shark changed from category 2 to category 1 this week; took away bread and wrote in "replace with 90 grams of protein." Pt unsure if the 90 grams of protein is correct. Please advise.

## 2018-08-09 NOTE — Telephone Encounter (Signed)
Called pt and informed she meant 90 calories not grams. She verbalized understanding.

## 2018-08-09 NOTE — Progress Notes (Signed)
Office: [phone]  /  Fax: [phone]   HPI:   Chief Complaint: OBESITY Brianna Wu is here to discuss her progress with her obesity treatment plan. She is on the Category 2 plan and is following her eating plan approximately 95 % of the time. She states she is exercising 0 minutes 0 times per week. Brianna Wu [place]. She is discouraged that she has not lost more weight. Brianna Wu states that she weighs and checks everything. Her weight is 189 lb (85.7 kg) today and she has maintained weight over a period of 3 weeks since her last visit. She has lost 6 lbs since starting treatment with [name].  Hyperlipidemia Brianna Wu has hyperlipidemia and she is taking Rosuvastatin 40 mg daily. She has been trying to improve her cholesterol levels with intensive lifestyle modification including a low saturated fat diet, exercise and weight loss. She denies any chest pain, claudication or myalgias.  Hypertension Brianna Wu is a 72 y.o. female with hypertension. She is currently taking Altace. Brianna Wu denies chest pain or  lightheadedness. She is working weight loss to help control her blood pressure with the goal of decreasing her risk of heart attack and stroke. Brianna Wu blood pressure is well controlled.  ALLERGIES: Allergies  Allergen Reactions  . Honey Bee Treatment [Bee Venom]   . Oxycodone-Acetaminophen   . Percocet [Oxycodone-Acetaminophen]     MEDICATIONS: Current Outpatient Medications on File Prior to Visit  Medication Sig Dispense Refill  . aspirin 81 MG tablet Take 81 mg by mouth daily.      . cholecalciferol (VITAMIN D) 1000 UNITS tablet Take 1,000 Units by mouth 5 (five) times daily.    Brianna Wu 500 MG CAPS Take 500 mg by mouth. Take two tabs at night    . levothyroxine (SYNTHROID, LEVOTHROID) 88 MCG tablet Take 88 mcg by mouth daily.    . magnesium gluconate (MAGONATE) 500 MG tablet Take 400 mg by mouth 4 (four) times daily.    . Multiple Vitamin  (MULTIVITAMIN) tablet Take 1 tablet by mouth daily.    . progesterone (PROMETRIUM) 100 MG capsule Take 100 mg by mouth daily.    Marland Kitchen pyridOXINE (VITAMIN B-6) 100 MG tablet Take 100 mg by mouth daily.    . ramipril (ALTACE) 5 MG capsule Take 5 mg by mouth daily.      . rosuvastatin (CRESTOR) 10 MG tablet Take 10 mg by mouth daily.    . vitamin C (ASCORBIC ACID) 500 MG tablet Take 500 mg by mouth daily.    . Zinc Gluconate 30 MG TABS Take 1 tablet by mouth daily.     No current facility-administered medications on file prior to visit.     PAST MEDICAL HISTORY: Past Medical History:  Diagnosis Date  . Aortic insufficiency   . Arthritis   . Cancer ([place])   . Constipation   . Depressed   . GERD (gastroesophageal reflux disease)   . Heart disease    vvalvular with dilated aortic root 4.2cm (echo 10-09)  . History of heart attack   . HLD (hyperlipidemia)   . HTN (hypertension)   . Hypothyroidism   . Pernicious anemia   . Renal cell carcinoma 2004   L stage I  . Scoliosis   . Seasonal asthma   . Stomach ulcer   . Upper abdominal pain     PAST SURGICAL HISTORY: Past Surgical History:  Procedure Laterality Date  . arthroscopic knee  x2  . bilat cataract surgery     Dr. Kirby Funk  . CESAREAN SECTION    . CHOLECYSTECTOMY  1974  . HYSTERECTOMY ABDOMINAL WITH SALPINGECTOMY    . INGUINAL HERNIA REPAIR  1970  . l nephrectomy for stage RCC  2005  . ltcs     x2  . SKIN CANCER EXCISION    . stomach stapling    . TONSILLECTOMY  1950  . TOTAL ABDOMINAL HYSTERECTOMY  1976    SOCIAL HISTORY: Social History   Tobacco Use  . Smoking status: Never Smoker  . Smokeless tobacco: Never Used  Substance Use Topics  . Alcohol use: Not on file  . Drug use: Not on file    FAMILY HISTORY: Family History  Problem Relation Age of Onset  . Breast cancer Mother   . Lung cancer Mother   . Depression Mother   . Hyperlipidemia Mother   . High blood pressure Mother   . Lung cancer Father    . Heart disease Father   . Lung cancer Brother     ROS: Review of Systems  Constitutional: Negative for weight loss.  Cardiovascular: Negative for chest pain and claudication.  Musculoskeletal: Negative for myalgias.  Neurological:       Negative for lightheadedness    PHYSICAL EXAM: Blood pressure 94/63, pulse (!) 52, temperature (!) 97 F (36.1 C), temperature source Oral, height 5\' 3"  (1.6 m), weight 189 lb (85.7 kg), SpO2 95 %. Body mass index is 33.48 kg/m. Physical Exam  Constitutional: She is oriented to person, place, and time. She appears well-developed and well-nourished.  Cardiovascular: Normal rate.  Pulmonary/Chest: Effort normal.  Musculoskeletal: Normal range of motion.  Neurological: She is oriented to person, place, and time.  Skin: Skin is warm and dry.  Psychiatric: She has a normal mood and affect. Her behavior is normal.  Vitals reviewed.   RECENT LABS AND TESTS: BMET    Component Value Date/Time   NA 142 07/12/2018 1111   K 4.3 07/12/2018 1111   CL 101 07/12/2018 1111   CO2 25 07/12/2018 1111   GLUCOSE 92 07/12/2018 1111   GLUCOSE 89 08/19/2010 1514   BUN 25 07/12/2018 1111   CREATININE 1.04 (H) 07/12/2018 1111   CALCIUM 9.1 07/12/2018 1111   GFRNONAA 54 (L) 07/12/2018 1111   GFRAA 62 07/12/2018 1111   Lab Results  Component Value Date   HGBA1C 5.3 07/12/2018   Lab Results  Component Value Date   INSULIN 5.8 07/12/2018   CBC    Component Value Date/Time   WBC 6.4 09/17/2009 0126   RBC 4.68 09/17/2009 0126   HGB 14.0 09/17/2009 0126   HCT 43.0 09/17/2009 0126   PLT 242 09/17/2009 0126   MCV 91.9 09/17/2009 0126   MCHC 32.6 09/17/2009 0126   RDW 13.3 09/17/2009 0126   LYMPHSABS 2.1 09/17/2009 0126   MONOABS 0.5 09/17/2009 0126   EOSABS 0.2 09/17/2009 0126   BASOSABS 0.1 09/17/2009 0126   Iron/TIBC/Ferritin/ %Sat    Component Value Date/Time   IRON 86 09/17/2009 0126   TIBC 320 09/17/2009 0126   FERRITIN 28 03/27/2008 2053    IRONPCTSAT 27 09/17/2009 0126   Lipid Panel     Component Value Date/Time   CHOL 283 (H) 07/12/2018 1111   TRIG 151 (H) 07/12/2018 1111   HDL 50 07/12/2018 1111   CHOLHDL 2.7 Ratio 04/11/2009 2004   VLDL 13 04/11/2009 2004   LDLCALC 203 (H) 07/12/2018 1111   LDLDIRECT 112 (  H) 01/10/2009 2318   Hepatic Function Panel     Component Value Date/Time   PROT 6.5 07/12/2018 1111   ALBUMIN 4.4 07/12/2018 1111   AST 13 07/12/2018 1111   ALT 11 07/12/2018 1111   ALKPHOS 40 07/12/2018 1111   BILITOT 0.7 07/12/2018 1111      Component Value Date/Time   TSH 3.380 07/12/2018 1111   TSH 1.360 11/28/2009 1458   TSH 2.957 09/17/2009 0126   Results for CHELCEE, KORPI (MRN [id number]) as of 08/09/2018 17:39  Ref. Range 07/12/2018 11:11  Vitamin D, 25-Hydroxy Latest Ref Range: 30.0 - 100.0 ng/mL 48.8   ASSESSMENT AND PLAN: Other hyperlipidemia  Essential hypertension  Class 1 obesity with serious comorbidity and body mass index (BMI) of 33.0 to 33.9 in adult, unspecified obesity type  PLAN:  Hyperlipidemia Brianna Wu was informed of the American Heart Association Guidelines emphasizing intensive lifestyle modifications as the first line treatment for hyperlipidemia. We discussed many lifestyle modifications today in depth, and Brianna Wu will continue to work on decreasing saturated fats such as fatty red meat, butter and many fried foods. She will also increase vegetables and lean protein in her diet and continue to work on exercise and weight loss efforts. Brianna Wu will continue to take her lipid lowering agent and will follow up as directed.  Hypertension We discussed sodium restriction, working on healthy weight loss, and a regular exercise program as the means to achieve improved blood pressure control. Brianna Wu agreed with this plan and agreed to follow up as directed. We will continue to monitor her blood pressure as well as her progress with the above lifestyle modifications. She will continue her  medications as prescribed and will watch for signs of hypotension as she continues her lifestyle modifications.  Obesity Brianna Wu is currently in the action stage of change. As such, her goal is to continue with weight loss efforts She has agreed to follow the Category 1 plan +100 calories Brianna Wu will begin chair exercises (YouTube) 3 to 4 times per week for weight loss and overall health benefits. We discussed the following Behavioral Modification Strategies today: increase H2O intake, no skipping meals, increasing lean protein intake, decreasing simple carbohydrates , increasing vegetables, decrease eating out and work on meal planning and easy cooking plans  Brianna Wu will cut out bread for lunch and add back in 90 calories of protein.  Brianna Wu has agreed to follow up with our clinic in 2 weeks. She was informed of the importance of frequent follow up visits to maximize her success with intensive lifestyle modifications for her multiple health conditions.   OBESITY BEHAVIORAL INTERVENTION VISIT  Today's visit was # 3   Starting weight: 195 lbs Starting date: 07/12/2018 Today's weight : 189 lbs Today's date: 08/07/2018 Total lbs lost to date: 6 At least 15 minutes were spent on discussing the following behavioral intervention visit.   ASK: We discussed the diagnosis of obesity with Brianna Wu today and Brianna Wu agreed to give [name] permission to discuss obesity behavioral modification therapy today.  ASSESS: Brianna Wu has the diagnosis of obesity and her BMI today is 33.49 Brianna Wu is in the action stage of change   ADVISE: Brianna Wu was educated on the multiple health risks of obesity as well as the benefit of weight loss to improve her health. She was advised of the need for long term treatment and the importance of lifestyle modifications to improve her current health and to decrease her risk of future health problems.  AGREE: Multiple dietary  modification options and treatment options were discussed and   Brianna Wu agreed to follow the recommendations documented in the above note.  ARRANGE: Brianna Wu was educated on the importance of frequent visits to treat obesity as outlined per CMS and USPSTF guidelines and agreed to schedule her next follow up appointment today.  Corey Skains, am acting as Location manager for [employer]. Owens Shark, DO  I have reviewed the above documentation for accuracy and completeness, and I agree with the above. -Jearld Lesch, DO

## 2018-08-23 ENCOUNTER — Ambulatory Visit (INDEPENDENT_AMBULATORY_CARE_PROVIDER_SITE_OTHER): Payer: Medicare Other | Admitting: Bariatrics

## 2018-08-23 ENCOUNTER — Encounter (INDEPENDENT_AMBULATORY_CARE_PROVIDER_SITE_OTHER): Payer: Self-pay | Admitting: Bariatrics

## 2018-08-23 VITALS — BP 110/63 | HR 52 | Temp 98.1°F | Ht 63.0 in | Wt 185.0 lb

## 2018-08-23 DIAGNOSIS — E7849 Other hyperlipidemia: Secondary | ICD-10-CM

## 2018-08-23 DIAGNOSIS — E669 Obesity, unspecified: Secondary | ICD-10-CM

## 2018-08-23 DIAGNOSIS — I10 Essential (primary) hypertension: Secondary | ICD-10-CM | POA: Diagnosis not present

## 2018-08-23 DIAGNOSIS — Z6832 Body mass index (BMI) 32.0-32.9, adult: Secondary | ICD-10-CM

## 2018-08-23 LAB — CBC AND DIFFERENTIAL
HCT: 42 (ref 36–46)
Hemoglobin: 12.8 (ref 12.0–16.0)
Platelets: 199 (ref 150–399)
WBC: 6.1

## 2018-08-23 LAB — HEPATIC FUNCTION PANEL
ALK PHOS: 44 (ref 25–125)
ALT: 21 (ref 7–35)
AST: 22 (ref 13–35)
Bilirubin, Direct: 0.6 — AB (ref 0.01–0.4)

## 2018-08-23 LAB — LIPID PANEL
Cholesterol: 126 (ref 0–200)
HDL: 40 (ref 35–70)
LDL Cholesterol: 65
Triglycerides: 104 (ref 40–160)

## 2018-08-23 LAB — TSH: TSH: 3.16 (ref 0.41–5.90)

## 2018-08-23 LAB — BASIC METABOLIC PANEL
BUN: 26 — AB (ref 4–21)
Creatinine: 1.3 — AB (ref 0.5–1.1)
GLUCOSE: 98
Potassium: 3.7 (ref 3.4–5.3)
Sodium: 145 (ref 137–147)

## 2018-08-28 NOTE — Progress Notes (Signed)
Office: [phone]  /  Fax: [phone]   HPI:   Chief Complaint: OBESITY Annesha is here to discuss her progress with her obesity treatment plan. She is on the Category 1 plan +100 calories and is following her eating plan approximately 95 % of the time. She states she is exercising 0 minutes 0 times per week. Brendaly is doing well overall. She states that she may not be getting enough protein. Her weight is 185 lb (83.9 kg) today and has had a weight loss of 4 pounds over a period of 2 weeks since her last visit. She has lost 10 lbs since starting treatment with [name].  Hyperlipidemia Samyah has hyperlipidemia and she is currently taking rosuvastatin. She has been trying to improve her cholesterol levels with intensive lifestyle modification including a low saturated fat diet, exercise and weight loss. She denies myalgias.  Hypertension AVYNN KLASSEN is a 72 y.o. female with hypertension. She is taking Propranolol 20 mg BID, Lisinopril 20 mg and Amlodipine HCTZ 2.5 mg half tablet. PINA SIRIANNI denies chest pain or shortness of breath on exertion. She is working weight loss to help control her blood pressure with the goal of decreasing her risk of heart attack and stroke. [name] blood pressure is well controlled.  ALLERGIES: Allergies  Allergen Reactions  . Honey Bee Treatment [Bee Venom]   . Oxycodone-Acetaminophen   . Percocet [Oxycodone-Acetaminophen]     MEDICATIONS: Current Outpatient Medications on File Prior to Visit  Medication Sig Dispense Refill  . amLODipine (NORVASC) 2.5 MG tablet Take 2.5 mg by mouth daily.    . cholecalciferol (VITAMIN D) 1000 UNITS tablet Take 1,000 Units by mouth 5 (five) times daily.    Marland Kitchen dicyclomine (BENTYL) 20 MG tablet Take 20 mg by mouth every 6 (six) hours.    . gabapentin (NEURONTIN) 300 MG capsule Take 300 mg by mouth 2 (two) times daily.    . hydrochlorothiazide (HYDRODIURIL) 25 MG tablet Take 25 mg by mouth daily.    Javier Docker Oil 500 MG CAPS  Take 500 mg by mouth. Take two tabs at night    . levothyroxine (SYNTHROID, LEVOTHROID) 50 MCG tablet Take 50 mcg by mouth daily.     Marland Kitchen lisinopril (PRINIVIL,ZESTRIL) 20 MG tablet Take 20 mg by mouth daily. 1/2 tab daily    . magnesium gluconate (MAGONATE) 500 MG tablet Take 400 mg by mouth 4 (four) times daily.    . Multiple Vitamin (MULTIVITAMIN) tablet Take 1 tablet by mouth daily.    Marland Kitchen omeprazole (PRILOSEC) 40 MG capsule Take 40 mg by mouth daily.    . propranolol (INDERAL) 20 MG tablet Take 20 mg by mouth 3 (three) times daily.    Marland Kitchen pyridOXINE (VITAMIN B-6) 100 MG tablet Take 100 mg by mouth daily.    . Rosuvastatin Calcium 40 MG CPSP Take 40 mg by mouth daily.     . vitamin C (ASCORBIC ACID) 500 MG tablet Take 500 mg by mouth daily.     No current facility-administered medications on file prior to visit.     PAST MEDICAL HISTORY: Past Medical History:  Diagnosis Date  . Aortic insufficiency   . Arthritis   . Cancer ([place])   . Constipation   . Depressed   . GERD (gastroesophageal reflux disease)   . Heart disease    vvalvular with dilated aortic root 4.2cm (echo 10-09)  . History of heart attack   . HLD (hyperlipidemia)   . HTN (hypertension)   .  Hypothyroidism   . Pernicious anemia   . Renal cell carcinoma 2004   L stage I  . Scoliosis   . Seasonal asthma   . Stomach ulcer   . Upper abdominal pain     PAST SURGICAL HISTORY: Past Surgical History:  Procedure Laterality Date  . arthroscopic knee     x2  . bilat cataract surgery     Dr. Kirby Funk  . CESAREAN SECTION    . CHOLECYSTECTOMY  1974  . HYSTERECTOMY ABDOMINAL WITH SALPINGECTOMY    . INGUINAL HERNIA REPAIR  1970  . l nephrectomy for stage RCC  2005  . ltcs     x2  . SKIN CANCER EXCISION    . stomach stapling    . TONSILLECTOMY  1950  . TOTAL ABDOMINAL HYSTERECTOMY  1976    SOCIAL HISTORY: Social History   Tobacco Use  . Smoking status: Never Smoker  . Smokeless tobacco: Never Used  Substance Use  Topics  . Alcohol use: Not on file  . Drug use: Not on file    FAMILY HISTORY: Family History  Problem Relation Age of Onset  . Breast cancer Mother   . Lung cancer Mother   . Depression Mother   . Hyperlipidemia Mother   . High blood pressure Mother   . Lung cancer Father   . Heart disease Father   . Lung cancer Brother     ROS: Review of Systems  Constitutional: Positive for weight loss.  Respiratory: Negative for shortness of breath (on exertion).   Cardiovascular: Negative for chest pain.  Musculoskeletal: Negative for myalgias.    PHYSICAL EXAM: Blood pressure 110/63, pulse (!) 52, temperature 98.1 F (36.7 C), temperature source Oral, height 5\' 3"  (1.6 m), weight 185 lb (83.9 kg), SpO2 92 %. Body mass index is 32.77 kg/m. Physical Exam  Constitutional: She is oriented to person, place, and time. She appears well-developed and well-nourished.  Cardiovascular: Normal rate.  Pulmonary/Chest: Effort normal.  Musculoskeletal: Normal range of motion.  Neurological: She is oriented to person, place, and time.  Skin: Skin is warm and dry.  Psychiatric: She has a normal mood and affect. Her behavior is normal.  Vitals reviewed.   RECENT LABS AND TESTS: BMET    Component Value Date/Time   NA 142 07/12/2018 1111   K 4.3 07/12/2018 1111   CL 101 07/12/2018 1111   CO2 25 07/12/2018 1111   GLUCOSE 92 07/12/2018 1111   GLUCOSE 89 08/19/2010 1514   BUN 25 07/12/2018 1111   CREATININE 1.04 (H) 07/12/2018 1111   CALCIUM 9.1 07/12/2018 1111   GFRNONAA 54 (L) 07/12/2018 1111   GFRAA 62 07/12/2018 1111   Lab Results  Component Value Date   HGBA1C 5.3 07/12/2018   Lab Results  Component Value Date   INSULIN 5.8 07/12/2018   CBC    Component Value Date/Time   WBC 6.4 09/17/2009 0126   RBC 4.68 09/17/2009 0126   HGB 14.0 09/17/2009 0126   HCT 43.0 09/17/2009 0126   PLT 242 09/17/2009 0126   MCV 91.9 09/17/2009 0126   MCHC 32.6 09/17/2009 0126   RDW 13.3  09/17/2009 0126   LYMPHSABS 2.1 09/17/2009 0126   MONOABS 0.5 09/17/2009 0126   EOSABS 0.2 09/17/2009 0126   BASOSABS 0.1 09/17/2009 0126   Iron/TIBC/Ferritin/ %Sat    Component Value Date/Time   IRON 86 09/17/2009 0126   TIBC 320 09/17/2009 0126   FERRITIN 28 03/27/2008 2053   IRONPCTSAT 27 09/17/2009 0126  Lipid Panel     Component Value Date/Time   CHOL 283 (H) 07/12/2018 1111   TRIG 151 (H) 07/12/2018 1111   HDL 50 07/12/2018 1111   CHOLHDL 2.7 Ratio 04/11/2009 2004   VLDL 13 04/11/2009 2004   LDLCALC 203 (H) 07/12/2018 1111   LDLDIRECT 112 (H) 01/10/2009 2318   Hepatic Function Panel     Component Value Date/Time   PROT 6.5 07/12/2018 1111   ALBUMIN 4.4 07/12/2018 1111   AST 13 07/12/2018 1111   ALT 11 07/12/2018 1111   ALKPHOS 40 07/12/2018 1111   BILITOT 0.7 07/12/2018 1111      Component Value Date/Time   TSH 3.380 07/12/2018 1111   TSH 1.360 11/28/2009 1458   TSH 2.957 09/17/2009 0126    Ref. Range 07/12/2018 11:11  Vitamin D, 25-Hydroxy Latest Ref Range: 30.0 - 100.0 ng/mL 48.8   ASSESSMENT AND PLAN: Other hyperlipidemia  Essential hypertension  Class 1 obesity with serious comorbidity and body mass index (BMI) of 32.0 to 32.9 in adult, unspecified obesity type  PLAN:  Hyperlipidemia Mylene was informed of the American Heart Association Guidelines emphasizing intensive lifestyle modifications as the first line treatment for hyperlipidemia. We discussed many lifestyle modifications today in depth, and Breah will continue to work on decreasing saturated fats such as fatty red meat, butter and many fried foods. She will also increase vegetables and lean protein in her diet and continue to work on exercise and weight loss efforts. Paxtyn will continue taking Rosuvastatin and will follow up as directed.  Hypertension We discussed sodium restriction, working on healthy weight loss, and a regular exercise program as the means to achieve improved blood pressure  control. Bena agreed with this plan and agreed to follow up as directed. We will continue to monitor her blood pressure as well as her progress with the above lifestyle modifications. She agrees to decrease Lisinopril 20 mg to half tablet daily and continue all other medications as prescribed, if blood pressure is okay and we will discontinue lisinopril at the next visit. Jameeka will watch for signs of hypotension as she continues her lifestyle modifications.  Obesity Jennfer is currently in the action stage of change. As such, her goal is to continue with weight loss efforts She has agreed to follow the Category 1 plan +100 calories Bridgitt has been instructed to work up to a goal of 150 minutes of combined cardio and strengthening exercise per week for weight loss and overall health benefits. 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 She was informed of the importance of frequent follow up visits to maximize her success with intensive lifestyle modifications for her multiple health conditions.   OBESITY BEHAVIORAL INTERVENTION VISIT  Today's visit was # 4   Starting weight: 195 lbs Starting date: 07/12/2018 Today's weight : 185 lbs Today's date: 08/23/2018 Total lbs lost to date: 10 At least 15 minutes were spent on discussing the following behavioral intervention visit.   ASK: We discussed the diagnosis of obesity with Corena Pilgrim today and Perina agreed to give [name] permission to discuss obesity behavioral modification therapy today.  ASSESS: Lyniah has the diagnosis of obesity and her BMI today is 32.78 Giannah is in the action stage of change  ADVISE: Akyla was educated on the multiple health  risks of obesity as well as the benefit of weight loss to improve her health. She was advised of the need for long term treatment and the importance of lifestyle modifications to improve her current health and to decrease her risk of future health problems.  AGREE: Multiple dietary modification options and treatment options were discussed and  Merrell agreed to follow the recommendations documented in the above note.  ARRANGE: Atlas was educated on the importance of frequent visits to treat obesity as outlined per CMS and USPSTF guidelines and agreed to schedule her next follow up appointment today.  Corey Skains, am acting as Location manager for [employer]. Owens Shark, DO  I have reviewed the above documentation for accuracy and completeness, and I agree with the above. -Jearld Lesch, DO

## 2018-08-30 DIAGNOSIS — M5416 Radiculopathy, lumbar region: Secondary | ICD-10-CM | POA: Diagnosis not present

## 2018-08-30 DIAGNOSIS — M5136 Other intervertebral disc degeneration, lumbar region: Secondary | ICD-10-CM | POA: Diagnosis not present

## 2018-08-30 DIAGNOSIS — Z01818 Encounter for other preprocedural examination: Secondary | ICD-10-CM | POA: Diagnosis not present

## 2018-09-04 DIAGNOSIS — M5116 Intervertebral disc disorders with radiculopathy, lumbar region: Secondary | ICD-10-CM | POA: Diagnosis not present

## 2018-09-11 ENCOUNTER — Ambulatory Visit (INDEPENDENT_AMBULATORY_CARE_PROVIDER_SITE_OTHER): Payer: Medicare Other | Admitting: Bariatrics

## 2018-09-11 ENCOUNTER — Encounter (INDEPENDENT_AMBULATORY_CARE_PROVIDER_SITE_OTHER): Payer: Self-pay | Admitting: Bariatrics

## 2018-09-11 VITALS — BP 119/73 | HR 55 | Temp 97.5°F | Ht 63.0 in | Wt 189.0 lb

## 2018-09-11 DIAGNOSIS — K5909 Other constipation: Secondary | ICD-10-CM

## 2018-09-11 DIAGNOSIS — Z6833 Body mass index (BMI) 33.0-33.9, adult: Secondary | ICD-10-CM

## 2018-09-11 DIAGNOSIS — E7849 Other hyperlipidemia: Secondary | ICD-10-CM

## 2018-09-11 DIAGNOSIS — E669 Obesity, unspecified: Secondary | ICD-10-CM | POA: Diagnosis not present

## 2018-09-11 DIAGNOSIS — I10 Essential (primary) hypertension: Secondary | ICD-10-CM | POA: Diagnosis not present

## 2018-09-11 MED ORDER — POLYETHYLENE GLYCOL 3350 17 GM/SCOOP PO POWD: 17.0000 g | Freq: Every day | ORAL | 0 refills | Status: AC

## 2018-09-11 NOTE — Progress Notes (Signed)
Office: [phone]  /  Fax: [phone]   HPI:   Chief Complaint: OBESITY Brianna Wu is here to discuss her progress with her obesity treatment plan. She is on the Category 1 plan +100 calories and is following her eating plan approximately 90 % of the time. She states she is exercising 0 minutes 0 times per week. Brianna Wu has struggled with the plan. She had a nerve stimulator placed in her back. Brianna Wu had a lot of nausea post surgery. She has an increase in water weight of 5 pounds. Her weight is 189 lb (85.7 kg) today and has had a weight gain of 4 pounds over a period of 2 to 3 weeks since her last visit. She has lost 6 lbs since starting treatment with [name].  Hypertension Brianna Wu is a 72 y.o. female with hypertension. She is taking Amlodipine, HCTZ-Lisinopril and Inderal. Brianna Wu denies chest pain or shortness of breath on exertion. She is working weight loss to help control her blood pressure with the goal of decreasing her risk of heart attack and stroke. [name] blood pressure is well controlled.  Hyperlipidemia Brianna Wu has hyperlipidemia and she is taking rosuvastatin. She has been trying to improve her cholesterol levels with intensive lifestyle modification including a low saturated fat diet, exercise and weight loss. She denies any myalgias.  Constipation Brianna Wu notes constipation for the last few weeks, worse since attempting weight loss. She states BM are irregular and her stools are firm.   ASSESSMENT AND PLAN:  Essential hypertension  Other hyperlipidemia  Other constipation - Plan: polyethylene glycol powder (GLYCOLAX/MIRALAX) powder  Class 1 obesity with serious comorbidity and body mass index (BMI) of 33.0 to 33.9 in adult, unspecified obesity type  PLAN:  Hypertension We discussed sodium restriction, working on healthy weight loss, and a regular exercise program as the means to achieve improved blood pressure control. Particia agreed with this plan and agreed to  follow up as directed. We will continue to monitor her blood pressure as well as her progress with the above lifestyle modifications. She will continue her medications as prescribed and will watch for signs of hypotension as she continues her lifestyle modifications.  Hyperlipidemia Brianna Wu was informed of the American Heart Association Guidelines emphasizing intensive lifestyle modifications as the first line treatment for hyperlipidemia. We discussed many lifestyle modifications today in depth, and Brianna Wu will continue to work on decreasing saturated fats such as fatty red meat, butter and many fried foods. She will also increase vegetables and lean protein in her diet and continue to work on exercise and weight loss efforts. Brianna Wu will continue her medications as prescribed.  Constipation [name] was informed decrease bowel movement frequency is normal while losing weight, but stools should not be hard or painful. She was advised to increase her H20 intake and work on increasing her fiber intake. High fiber foods were discussed today. [name] agreed to use Miralax daily 17 grams of powder once daily for 1 week then 3 times per week #1 month supply with no refills and follow up as directed.  Obesity Brianna Wu is currently in the action stage of change. As such, her goal is to continue with weight loss efforts She has agreed to follow the Category 1 plan +100 calories Brianna Wu has been instructed to work up to a goal of 150 minutes of combined cardio and strengthening exercise per week for weight loss and overall health benefits. We discussed the following Behavioral Modification Strategies today: increase H2O intake, no skipping  meals, keeping healthy foods in the home, better snacking choices, increasing lean protein intake, decreasing simple carbohydrates , increasing vegetables and work on meal planning and easy cooking plans  [name] will save her snacks for the nighttime. Smart fruit handouts were provided to patient  today.  Brianna Wu has agreed to follow up with our clinic in 2 weeks. She was informed of the importance of frequent follow up visits to maximize her success with intensive lifestyle modifications for her multiple health conditions.  ALLERGIES: Allergies  Allergen Reactions  . Honey Bee Treatment [Bee Venom]   . Oxycodone-Acetaminophen   . Percocet [Oxycodone-Acetaminophen]     MEDICATIONS: Current Outpatient Medications on File Prior to Visit  Medication Sig Dispense Refill  . amLODipine (NORVASC) 2.5 MG tablet Take 2.5 mg by mouth daily.    . cholecalciferol (VITAMIN D) 1000 UNITS tablet Take 1,000 Units by mouth 5 (five) times daily.    Brianna Wu dicyclomine (BENTYL) 20 MG tablet Take 20 mg by mouth every 6 (six) hours.    . gabapentin (NEURONTIN) 300 MG capsule Take 300 mg by mouth 2 (two) times daily.    . hydrochlorothiazide (HYDRODIURIL) 25 MG tablet Take 25 mg by mouth daily.    Brianna Wu Oil 500 MG CAPS Take 500 mg by mouth. Take two tabs at night    . levothyroxine (SYNTHROID, LEVOTHROID) 50 MCG tablet Take 50 mcg by mouth daily.     Brianna Wu lisinopril (PRINIVIL,ZESTRIL) 20 MG tablet Take 20 mg by mouth daily. 1/2 tab daily    . magnesium gluconate (MAGONATE) 500 MG tablet Take 400 mg by mouth 4 (four) times daily.    . Multiple Vitamin (MULTIVITAMIN) tablet Take 1 tablet by mouth daily.    Brianna Wu omeprazole (PRILOSEC) 40 MG capsule Take 40 mg by mouth daily.    . propranolol (INDERAL) 20 MG tablet Take 20 mg by mouth 3 (three) times daily.    Brianna Wu pyridOXINE (VITAMIN B-6) 100 MG tablet Take 100 mg by mouth daily.    . Rosuvastatin Calcium 40 MG CPSP Take 40 mg by mouth daily.     . vitamin C (ASCORBIC ACID) 500 MG tablet Take 500 mg by mouth daily.     No current facility-administered medications on file prior to visit.     PAST MEDICAL HISTORY: Past Medical History:  Diagnosis Date  . Aortic insufficiency   . Arthritis   . Cancer ([place])   . Constipation   . Depressed   . GERD  (gastroesophageal reflux disease)   . Heart disease    vvalvular with dilated aortic root 4.2cm (echo 10-09)  . History of heart attack   . HLD (hyperlipidemia)   . HTN (hypertension)   . Hypothyroidism   . Pernicious anemia   . Renal cell carcinoma 2004   L stage I  . Scoliosis   . Seasonal asthma   . Stomach ulcer   . Upper abdominal pain     PAST SURGICAL HISTORY: Past Surgical History:  Procedure Laterality Date  . arthroscopic knee     x2  . bilat cataract surgery     Dr. Kirby Funk  . CESAREAN SECTION    . CHOLECYSTECTOMY  1974  . HYSTERECTOMY ABDOMINAL WITH SALPINGECTOMY    . INGUINAL HERNIA REPAIR  1970  . l nephrectomy for stage RCC  2005  . ltcs     x2  . SKIN CANCER EXCISION    . stomach stapling    . TONSILLECTOMY  1950  .  TOTAL ABDOMINAL HYSTERECTOMY  1976    SOCIAL HISTORY: Social History   Tobacco Use  . Smoking status: Never Smoker  . Smokeless tobacco: Never Used  Substance Use Topics  . Alcohol use: Not on file  . Drug use: Not on file    FAMILY HISTORY: Family History  Problem Relation Age of Onset  . Breast cancer Mother   . Lung cancer Mother   . Depression Mother   . Hyperlipidemia Mother   . High blood pressure Mother   . Lung cancer Father   . Heart disease Father   . Lung cancer Brother     ROS: Review of Systems  Constitutional: Negative for weight loss.  Respiratory: Negative for shortness of breath (on exertion).   Cardiovascular: Negative for chest pain.  Gastrointestinal: Positive for constipation.  Musculoskeletal: Negative for myalgias.    PHYSICAL EXAM: Blood pressure 119/73, pulse (!) 55, temperature (!) 97.5 F (36.4 C), temperature source Oral, height 5\' 3"  (1.6 m), weight 189 lb (85.7 kg), SpO2 95 %. Body mass index is 33.48 kg/m. Physical Exam Vitals signs reviewed.  Constitutional:      Appearance: Normal appearance. She is well-developed. She is obese.  Cardiovascular:     Rate and Rhythm: Normal rate.   Pulmonary:     Effort: Pulmonary effort is normal.  Musculoskeletal: Normal range of motion.  Skin:    General: Skin is warm and dry.  Neurological:     Mental Status: She is alert and oriented to person, place, and time.  Psychiatric:        Mood and Affect: Mood normal.        Behavior: Behavior normal.     RECENT LABS AND TESTS: BMET    Component Value Date/Time   NA 142 07/12/2018 1111   K 4.3 07/12/2018 1111   CL 101 07/12/2018 1111   CO2 25 07/12/2018 1111   GLUCOSE 92 07/12/2018 1111   GLUCOSE 89 08/19/2010 1514   BUN 25 07/12/2018 1111   CREATININE 1.04 (H) 07/12/2018 1111   CALCIUM 9.1 07/12/2018 1111   GFRNONAA 54 (L) 07/12/2018 1111   GFRAA 62 07/12/2018 1111   Lab Results  Component Value Date   HGBA1C 5.3 07/12/2018   Lab Results  Component Value Date   INSULIN 5.8 07/12/2018   CBC    Component Value Date/Time   WBC 6.4 09/17/2009 0126   RBC 4.68 09/17/2009 0126   HGB 14.0 09/17/2009 0126   HCT 43.0 09/17/2009 0126   PLT 242 09/17/2009 0126   MCV 91.9 09/17/2009 0126   MCHC 32.6 09/17/2009 0126   RDW 13.3 09/17/2009 0126   LYMPHSABS 2.1 09/17/2009 0126   MONOABS 0.5 09/17/2009 0126   EOSABS 0.2 09/17/2009 0126   BASOSABS 0.1 09/17/2009 0126   Iron/TIBC/Ferritin/ %Sat    Component Value Date/Time   IRON 86 09/17/2009 0126   TIBC 320 09/17/2009 0126   FERRITIN 28 03/27/2008 2053   IRONPCTSAT 27 09/17/2009 0126   Lipid Panel     Component Value Date/Time   CHOL 283 (H) 07/12/2018 1111   TRIG 151 (H) 07/12/2018 1111   HDL 50 07/12/2018 1111   CHOLHDL 2.7 Ratio 04/11/2009 2004   VLDL 13 04/11/2009 2004   LDLCALC 203 (H) 07/12/2018 1111   LDLDIRECT 112 (H) 01/10/2009 2318   Hepatic Function Panel     Component Value Date/Time   PROT 6.5 07/12/2018 1111   ALBUMIN 4.4 07/12/2018 1111   AST 13 07/12/2018 1111  ALT 11 07/12/2018 1111   ALKPHOS 40 07/12/2018 1111   BILITOT 0.7 07/12/2018 1111      Component Value Date/Time   TSH  3.380 07/12/2018 1111   TSH 1.360 11/28/2009 1458   TSH 2.957 09/17/2009 0126    Ref. Range 07/12/2018 11:11  Vitamin D, 25-Hydroxy Latest Ref Range: 30.0 - 100.0 ng/mL 48.8     OBESITY BEHAVIORAL INTERVENTION VISIT  Today's visit was # 5   Starting weight: 195 lbs Starting date: 07/12/2018 Today's weight : 189 lbs Today's date: 09/11/2018 Total lbs lost to date: 6 At least 15 minutes were spent on discussing the following behavioral intervention visit.   ASK: We discussed the diagnosis of obesity with Corena Pilgrim today and Falana agreed to give [name] permission to discuss obesity behavioral modification therapy today.  ASSESS: Kynley has the diagnosis of obesity and her BMI today is 33.49 Shallen is in the action stage of change   ADVISE: Cynai was educated on the multiple health risks of obesity as well as the benefit of weight loss to improve her health. She was advised of the need for long term treatment and the importance of lifestyle modifications to improve her current health and to decrease her risk of future health problems.  AGREE: Multiple dietary modification options and treatment options were discussed and  Giuliana agreed to follow the recommendations documented in the above note.  ARRANGE: Kira was educated on the importance of frequent visits to treat obesity as outlined per CMS and USPSTF guidelines and agreed to schedule her next follow up appointment today.  Corey Skains, am acting as Location manager for [employer]. Owens Shark, DO  I have reviewed the above documentation for accuracy and completeness, and I agree with the above. -Jearld Lesch, DO

## 2018-09-28 ENCOUNTER — Ambulatory Visit (INDEPENDENT_AMBULATORY_CARE_PROVIDER_SITE_OTHER): Payer: Medicare Other | Admitting: Bariatrics

## 2018-09-28 ENCOUNTER — Encounter (INDEPENDENT_AMBULATORY_CARE_PROVIDER_SITE_OTHER): Payer: Self-pay | Admitting: Bariatrics

## 2018-09-28 VITALS — BP 108/71 | HR 56 | Temp 97.6°F | Ht 63.0 in | Wt 186.0 lb

## 2018-09-28 DIAGNOSIS — I10 Essential (primary) hypertension: Secondary | ICD-10-CM

## 2018-09-28 DIAGNOSIS — E669 Obesity, unspecified: Secondary | ICD-10-CM

## 2018-09-28 DIAGNOSIS — E7849 Other hyperlipidemia: Secondary | ICD-10-CM | POA: Diagnosis not present

## 2018-09-28 DIAGNOSIS — Z6833 Body mass index (BMI) 33.0-33.9, adult: Secondary | ICD-10-CM | POA: Diagnosis not present

## 2018-10-02 ENCOUNTER — Encounter (INDEPENDENT_AMBULATORY_CARE_PROVIDER_SITE_OTHER): Payer: Self-pay

## 2018-10-02 NOTE — Progress Notes (Signed)
Office: [phone]  /  Fax: [phone]   HPI:   Chief Complaint: OBESITY Brianna Wu is here to discuss her progress with her obesity treatment plan. She is on the Category 1 plan + 100 calories and is following her eating plan approximately 80 % of the time. She states she is exercising 0 minutes 0 times per week. Brianna Wu is doing well overall. She did struggle slightly with the holidays. She decreased her sugar. Brianna Wu has a hard time getting in all of her food. (she has struggled with her water) Her weight is 186 lb (84.4 kg) today and has had a weight loss of 3 pounds over a period of 2 weeks since her last visit. She has lost 9 lbs since starting treatment with [name].  Hypertension Brianna Wu is a 73 y.o. female with hypertension. She is taking HCTZ, Lisinopril and Inderal. She stopped taking Amlodipine and Lisinopril. Brianna Wu denies chest pain or shortness of breath on exertion. She is working weight loss to help control her blood pressure with the goal of decreasing her risk of heart attack and stroke. Brianna Wu blood pressure is well controlled.  Hyperlipidemia Brianna Wu has hyperlipidemia and she is taking Rosuvastatin. She has been trying to improve her cholesterol levels with intensive lifestyle modification including a low saturated fat diet, exercise and weight loss. She denies any myalgias.  ASSESSMENT AND PLAN:  Essential hypertension  Other hyperlipidemia  Class 1 obesity with serious comorbidity and body mass index (BMI) of 33.0 to 33.9 in adult, unspecified obesity type  PLAN:  Hypertension We discussed sodium restriction, working on healthy weight loss, and a regular exercise program as the means to achieve improved blood pressure control. Brianna Wu agreed with this plan and agreed to follow up as directed. We will continue to monitor her blood pressure as well as her progress with the above lifestyle modifications. She will continue her medications as prescribed and will watch  for signs of hypotension as she continues her lifestyle modifications.  Hyperlipidemia Brianna Wu was informed of the American Heart Association Guidelines emphasizing intensive lifestyle modifications as the first line treatment for hyperlipidemia. We discussed many lifestyle modifications today in depth, and Brianna Wu will continue to work on decreasing saturated fats such as fatty red meat, butter and many fried foods. She will also increase vegetables and lean protein in her diet and continue to work on exercise and weight loss efforts. Brianna Wu will continue Rosuvastatin as prescribed and follow up as directed.  Obesity Brianna Wu is currently in the action stage of change. As such, her goal is to continue with weight loss efforts She has agreed to follow the Category 1 plan Brianna Wu has been instructed to work up to a goal of 150 minutes of combined cardio and strengthening exercise per week for weight loss and overall health benefits. We discussed the following Behavioral Modification Strategies today: increase H2O intake, increasing lean protein intake, decreasing simple carbohydrates, increasing vegetables, work on meal planning and easy cooking plans and decrease liquid calories Handout for homemade seasonings and store bought seasonings were provided to patient today. Brianna Wu will pace out her food during the day. Handouts for Category 1 and Category 2 breakfast options were given today.  Brianna Wu has agreed to follow up with our clinic in 3 weeks. She was informed of the importance of frequent follow up visits to maximize her success with intensive lifestyle modifications for her multiple health conditions.  ALLERGIES: Allergies  Allergen Reactions  . Honey Bee Treatment [Bee Venom]   .  Oxycodone-Acetaminophen   . Percocet [Oxycodone-Acetaminophen]     MEDICATIONS: Current Outpatient Medications on File Prior to Visit  Medication Sig Dispense Refill  . cholecalciferol (VITAMIN D) 1000 UNITS tablet Take 1,000  Units by mouth 5 (five) times daily.    Marland Kitchen dicyclomine (BENTYL) 20 MG tablet Take 20 mg by mouth every 6 (six) hours.    . gabapentin (NEURONTIN) 300 MG capsule Take 300 mg by mouth 2 (two) times daily.    . hydrochlorothiazide (HYDRODIURIL) 25 MG tablet Take 25 mg by mouth daily.    Javier Docker Oil 500 MG CAPS Take 500 mg by mouth. Take two tabs at night    . levothyroxine (SYNTHROID, LEVOTHROID) 50 MCG tablet Take 50 mcg by mouth daily.     Marland Kitchen lisinopril (PRINIVIL,ZESTRIL) 20 MG tablet Take 20 mg by mouth daily. 1/2 tab daily    . magnesium gluconate (MAGONATE) 500 MG tablet Take 400 mg by mouth 4 (four) times daily.    . Multiple Vitamin (MULTIVITAMIN) tablet Take 1 tablet by mouth daily.    Marland Kitchen omeprazole (PRILOSEC) 40 MG capsule Take 40 mg by mouth daily.    . polyethylene glycol powder (GLYCOLAX/MIRALAX) powder Take 17 g by mouth daily. (Patient not taking: Reported on 09/28/2018) 3350 g 0  . propranolol (INDERAL) 20 MG tablet Take 20 mg by mouth 3 (three) times daily.    Marland Kitchen pyridOXINE (VITAMIN B-6) 100 MG tablet Take 100 mg by mouth daily.    . Rosuvastatin Calcium 40 MG CPSP Take 40 mg by mouth daily.     . vitamin C (ASCORBIC ACID) 500 MG tablet Take 500 mg by mouth daily.     No current facility-administered medications on file prior to visit.     PAST MEDICAL HISTORY: Past Medical History:  Diagnosis Date  . Aortic insufficiency   . Arthritis   . Cancer ([place])   . Constipation   . Depressed   . GERD (gastroesophageal reflux disease)   . Heart disease    vvalvular with dilated aortic root 4.2cm (echo 10-09)  . History of heart attack   . HLD (hyperlipidemia)   . HTN (hypertension)   . Hypothyroidism   . Pernicious anemia   . Renal cell carcinoma 2004   L stage I  . Scoliosis   . Seasonal asthma   . Stomach ulcer   . Upper abdominal pain     PAST SURGICAL HISTORY: Past Surgical History:  Procedure Laterality Date  . arthroscopic knee     x2  . bilat cataract surgery      Dr. Kirby Funk  . CESAREAN SECTION    . CHOLECYSTECTOMY  1974  . HYSTERECTOMY ABDOMINAL WITH SALPINGECTOMY    . INGUINAL HERNIA REPAIR  1970  . l nephrectomy for stage RCC  2005  . ltcs     x2  . SKIN CANCER EXCISION    . stomach stapling    . TONSILLECTOMY  1950  . TOTAL ABDOMINAL HYSTERECTOMY  1976    SOCIAL HISTORY: Social History   Tobacco Use  . Smoking status: Never Smoker  . Smokeless tobacco: Never Used  Substance Use Topics  . Alcohol use: Not on file  . Drug use: Not on file    FAMILY HISTORY: Family History  Problem Relation Age of Onset  . Breast cancer Mother   . Lung cancer Mother   . Depression Mother   . Hyperlipidemia Mother   . High blood pressure Mother   . Lung cancer  Father   . Heart disease Father   . Lung cancer Brother     ROS: Review of Systems  Constitutional: Positive for weight loss.  Respiratory: Negative for shortness of breath (on exertion).   Cardiovascular: Negative for chest pain.  Musculoskeletal: Negative for myalgias.    PHYSICAL EXAM: Blood pressure 108/71, pulse (!) 56, temperature 97.6 F (36.4 C), temperature source Oral, height 5\' 3"  (1.6 m), weight 186 lb (84.4 kg), SpO2 95 %. Body mass index is 32.95 kg/m. Physical Exam Vitals signs reviewed.  Constitutional:      Appearance: Normal appearance. She is well-developed. She is obese.  Cardiovascular:     Rate and Rhythm: Normal rate.  Pulmonary:     Effort: Pulmonary effort is normal.  Musculoskeletal: Normal range of motion.  Skin:    General: Skin is warm and dry.  Neurological:     Mental Status: She is alert and oriented to person, place, and time.  Psychiatric:        Mood and Affect: Mood normal.        Behavior: Behavior normal.     RECENT LABS AND TESTS: BMET    Component Value Date/Time   NA 142 07/12/2018 1111   K 4.3 07/12/2018 1111   CL 101 07/12/2018 1111   CO2 25 07/12/2018 1111   GLUCOSE 92 07/12/2018 1111   GLUCOSE 89 08/19/2010 1514     BUN 25 07/12/2018 1111   CREATININE 1.04 (H) 07/12/2018 1111   CALCIUM 9.1 07/12/2018 1111   GFRNONAA 54 (L) 07/12/2018 1111   GFRAA 62 07/12/2018 1111   Lab Results  Component Value Date   HGBA1C 5.3 07/12/2018   Lab Results  Component Value Date   INSULIN 5.8 07/12/2018   CBC    Component Value Date/Time   WBC 6.4 09/17/2009 0126   RBC 4.68 09/17/2009 0126   HGB 14.0 09/17/2009 0126   HCT 43.0 09/17/2009 0126   PLT 242 09/17/2009 0126   MCV 91.9 09/17/2009 0126   MCHC 32.6 09/17/2009 0126   RDW 13.3 09/17/2009 0126   LYMPHSABS 2.1 09/17/2009 0126   MONOABS 0.5 09/17/2009 0126   EOSABS 0.2 09/17/2009 0126   BASOSABS 0.1 09/17/2009 0126   Iron/TIBC/Ferritin/ %Sat    Component Value Date/Time   IRON 86 09/17/2009 0126   TIBC 320 09/17/2009 0126   FERRITIN 28 03/27/2008 2053   IRONPCTSAT 27 09/17/2009 0126   Lipid Panel     Component Value Date/Time   CHOL 283 (H) 07/12/2018 1111   TRIG 151 (H) 07/12/2018 1111   HDL 50 07/12/2018 1111   CHOLHDL 2.7 Ratio 04/11/2009 2004   VLDL 13 04/11/2009 2004   LDLCALC 203 (H) 07/12/2018 1111   LDLDIRECT 112 (H) 01/10/2009 2318   Hepatic Function Panel     Component Value Date/Time   PROT 6.5 07/12/2018 1111   ALBUMIN 4.4 07/12/2018 1111   AST 13 07/12/2018 1111   ALT 11 07/12/2018 1111   ALKPHOS 40 07/12/2018 1111   BILITOT 0.7 07/12/2018 1111      Component Value Date/Time   TSH 3.380 07/12/2018 1111   TSH 1.360 11/28/2009 1458   TSH 2.957 09/17/2009 0126     Ref. Range 07/12/2018 11:11  Vitamin D, 25-Hydroxy Latest Ref Range: 30.0 - 100.0 ng/mL 48.8     OBESITY BEHAVIORAL INTERVENTION VISIT  Today's visit was # 6   Starting weight: 195 lbs Starting date: 07/12/2018 Today's weight : 186 lbs Today's date: 09/28/2018 Total lbs lost  to date: 9 At least 15 minutes were spent on discussing the following behavioral intervention visit.   ASK: We discussed the diagnosis of obesity with Brianna Wu  today and Brianna Wu agreed to give [name] permission to discuss obesity behavioral modification therapy today.  ASSESS: Jerni has the diagnosis of obesity and her BMI today is 32.96 Pennelope is in the action stage of change   ADVISE: Kamyiah was educated on the multiple health risks of obesity as well as the benefit of weight loss to improve her health. She was advised of the need for long term treatment and the importance of lifestyle modifications to improve her current health and to decrease her risk of future health problems.  AGREE: Multiple dietary modification options and treatment options were discussed and  Brianna Wu agreed to follow the recommendations documented in the above note.  ARRANGE: Brianna Wu was educated on the importance of frequent visits to treat obesity as outlined per CMS and USPSTF guidelines and agreed to schedule her next follow up appointment today.  Corey Skains, am acting as Location manager for [employer]. Owens Shark, DO  I have reviewed the above documentation for accuracy and completeness, and I agree with the above. -Jearld Lesch, DO

## 2018-10-19 ENCOUNTER — Ambulatory Visit (INDEPENDENT_AMBULATORY_CARE_PROVIDER_SITE_OTHER): Payer: Medicare Other | Admitting: Family Medicine

## 2018-11-08 ENCOUNTER — Encounter (INDEPENDENT_AMBULATORY_CARE_PROVIDER_SITE_OTHER): Payer: Self-pay | Admitting: Family Medicine

## 2018-11-08 ENCOUNTER — Ambulatory Visit (INDEPENDENT_AMBULATORY_CARE_PROVIDER_SITE_OTHER): Payer: Medicare Other | Admitting: Family Medicine

## 2018-11-08 VITALS — BP 111/59 | HR 48 | Temp 97.7°F | Ht 63.0 in | Wt 182.0 lb

## 2018-11-08 DIAGNOSIS — Z6832 Body mass index (BMI) 32.0-32.9, adult: Secondary | ICD-10-CM | POA: Diagnosis not present

## 2018-11-08 DIAGNOSIS — E669 Obesity, unspecified: Secondary | ICD-10-CM

## 2018-11-08 DIAGNOSIS — I10 Essential (primary) hypertension: Secondary | ICD-10-CM | POA: Diagnosis not present

## 2018-11-08 NOTE — Progress Notes (Signed)
Office: [phone]  /  Fax: [phone]   HPI:   Chief Complaint: OBESITY Brianna Wu is here to discuss her progress with her obesity treatment plan. She is on the Category 1 plan and is following her eating plan approximately 80% of the time. She states she is walking 1 mile 3 times per week. Brianna Wu is frustrated with slow weight loss. She does eat all protein on the plan. She does report occasional polyphagia in the evenings. Her weight is 182 lb (82.6 kg) today and has had a weight loss of 4 pounds over a period of 6 weeks since her last visit. She has lost 13 lbs since starting treatment with [name].  Hypertension Brianna Wu is a 73 y.o. female with hypertension which is well controlled on lisinopril and propranolol.  Brianna Wu denies chest pain, dizziness, shortness of breath. She is working weight loss to help control her blood pressure with the goal of decreasing her risk of heart attack and stroke. We discussed hypotension as today's blood pressure is 111/l59.  ASSESSMENT AND PLAN:  Essential hypertension  Class 1 obesity with serious comorbidity and body mass index (BMI) of 32.0 to 32.9 in adult, unspecified obesity type  PLAN:  Hypertension We discussed sodium restriction, working on healthy weight loss, and a regular exercise program as the means to achieve improved blood pressure control. Brianna Wu agreed with this plan and agreed to follow up as directed. We will continue to monitor her blood pressure as well as her progress with the above lifestyle modifications. She will continue her medications as prescribed and will watch for signs of hypotension as she continues her lifestyle modifications.  I spent > than 50% of the 15 minute visit on counseling as documented in the note.  Obesity Brianna Wu is currently in the action stage of change. As such, her goal is to continue with weight loss efforts. She has agreed to follow the Category 1 plan + 100 calories of protein.  Encouragement was provided. Bethel will continue current exercise regimen for weight loss and overall health benefits. We discussed the following Behavioral Modification Strategies today: planning for success.  Brianna Wu has agreed to follow-up with our clinic in 3 weeks. She was informed of the importance of frequent follow up visits to maximize her success with intensive lifestyle modifications for her multiple health conditions.  ALLERGIES: Allergies  Allergen Reactions  . Honey Bee Treatment [Bee Venom]   . Oxycodone-Acetaminophen   . Percocet [Oxycodone-Acetaminophen]     MEDICATIONS: Current Outpatient Medications on File Prior to Visit  Medication Sig Dispense Refill  . cholecalciferol (VITAMIN D) 1000 UNITS tablet Take 1,000 Units by mouth 5 (five) times daily.    Marland Kitchen dicyclomine (BENTYL) 20 MG tablet Take 20 mg by mouth every 6 (six) hours.    . gabapentin (NEURONTIN) 300 MG capsule Take 300 mg by mouth 2 (two) times daily.    . hydrochlorothiazide (HYDRODIURIL) 25 MG tablet Take 25 mg by mouth daily.    Brianna Wu Oil 500 MG CAPS Take 500 mg by mouth. Take two tabs at night    . levothyroxine (SYNTHROID, LEVOTHROID) 50 MCG tablet Take 50 mcg by mouth daily.     Marland Kitchen lisinopril (PRINIVIL,ZESTRIL) 20 MG tablet Take 20 mg by mouth daily. 1/2 tab daily    . magnesium gluconate (MAGONATE) 500 MG tablet Take 400 mg by mouth 4 (four) times daily.    . Multiple Vitamin (MULTIVITAMIN) tablet Take 1 tablet by mouth daily.    Marland Kitchen  omeprazole (PRILOSEC) 40 MG capsule Take 40 mg by mouth daily.    . polyethylene glycol powder (GLYCOLAX/MIRALAX) powder Take 17 g by mouth daily. 3350 g 0  . propranolol (INDERAL) 20 MG tablet Take 20 mg by mouth 3 (three) times daily.    Marland Kitchen pyridOXINE (VITAMIN B-6) 100 MG tablet Take 100 mg by mouth daily.    . Rosuvastatin Calcium 40 MG CPSP Take 40 mg by mouth daily.     . vitamin C (ASCORBIC ACID) 500 MG tablet Take 500 mg by mouth daily.     No current  facility-administered medications on file prior to visit.     PAST MEDICAL HISTORY: Past Medical History:  Diagnosis Date  . Aortic insufficiency   . Arthritis   . Cancer ([place])   . Constipation   . Depressed   . GERD (gastroesophageal reflux disease)   . Heart disease    vvalvular with dilated aortic root 4.2cm (echo 10-09)  . History of heart attack   . HLD (hyperlipidemia)   . HTN (hypertension)   . Hypothyroidism   . Pernicious anemia   . Renal cell carcinoma 2004   L stage I  . Scoliosis   . Seasonal asthma   . Stomach ulcer   . Upper abdominal pain     PAST SURGICAL HISTORY: Past Surgical History:  Procedure Laterality Date  . arthroscopic knee     x2  . bilat cataract surgery     Dr. Kirby Funk  . CESAREAN SECTION    . CHOLECYSTECTOMY  1974  . HYSTERECTOMY ABDOMINAL WITH SALPINGECTOMY    . INGUINAL HERNIA REPAIR  1970  . l nephrectomy for stage RCC  2005  . ltcs     x2  . SKIN CANCER EXCISION    . stomach stapling    . TONSILLECTOMY  1950  . TOTAL ABDOMINAL HYSTERECTOMY  1976    SOCIAL HISTORY: Social History   Tobacco Use  . Smoking status: Never Smoker  . Smokeless tobacco: Never Used  Substance Use Topics  . Alcohol use: Not on file  . Drug use: Not on file    FAMILY HISTORY: Family History  Problem Relation Age of Onset  . Breast cancer Mother   . Lung cancer Mother   . Depression Mother   . Hyperlipidemia Mother   . High blood pressure Mother   . Lung cancer Father   . Heart disease Father   . Lung cancer Brother    ROS: Review of Systems  Constitutional: Positive for weight loss.  Respiratory: Negative for shortness of breath.   Cardiovascular: Negative for chest pain.  Neurological: Negative for dizziness.   PHYSICAL EXAM: Blood pressure (!) 111/59, pulse (!) 48, temperature 97.7 F (36.5 C), height 5\' 3"  (1.6 m), weight 182 lb (82.6 kg), SpO2 97 %. Body mass index is 32.24 kg/m. Physical Exam Vitals signs reviewed.    Constitutional:      Appearance: Normal appearance. She is obese.  Cardiovascular:     Rate and Rhythm: Normal rate.     Pulses: Normal pulses.  Pulmonary:     Effort: Pulmonary effort is normal.     Breath sounds: Normal breath sounds.  Musculoskeletal: Normal range of motion.  Skin:    General: Skin is warm and dry.  Neurological:     Mental Status: She is alert and oriented to person, place, and time.  Psychiatric:        Behavior: Behavior normal.   RECENT LABS AND  TESTS: BMET    Component Value Date/Time   NA 145 08/22/2018   K 3.7 08/22/2018   CL 101 07/12/2018 1111   CO2 25 07/12/2018 1111   GLUCOSE 92 07/12/2018 1111   GLUCOSE 89 08/19/2010 1514   BUN 26 (A) 08/22/2018   CREATININE 1.3 (A) 08/22/2018   CREATININE 1.04 (H) 07/12/2018 1111   CALCIUM 9.1 07/12/2018 1111   GFRNONAA 54 (L) 07/12/2018 1111   GFRAA 62 07/12/2018 1111   Lab Results  Component Value Date   HGBA1C 5.3 07/12/2018   Lab Results  Component Value Date   INSULIN 5.8 07/12/2018   CBC    Component Value Date/Time   WBC 6.1 08/22/2018   WBC 6.4 09/17/2009 0126   RBC 4.68 09/17/2009 0126   HGB 12.8 08/22/2018   HCT 42 08/22/2018   PLT 199 08/22/2018   MCV 91.9 09/17/2009 0126   MCHC 32.6 09/17/2009 0126   RDW 13.3 09/17/2009 0126   LYMPHSABS 2.1 09/17/2009 0126   MONOABS 0.5 09/17/2009 0126   EOSABS 0.2 09/17/2009 0126   BASOSABS 0.1 09/17/2009 0126   Iron/TIBC/Ferritin/ %Sat    Component Value Date/Time   IRON 86 09/17/2009 0126   TIBC 320 09/17/2009 0126   FERRITIN 28 03/27/2008 2053   IRONPCTSAT 27 09/17/2009 0126   Lipid Panel     Component Value Date/Time   CHOL 126 08/22/2018   CHOL 283 (H) 07/12/2018 1111   TRIG 104 08/22/2018   HDL 40 08/22/2018   HDL 50 07/12/2018 1111   CHOLHDL 2.7 Ratio 04/11/2009 2004   VLDL 13 04/11/2009 2004   LDLCALC 65 08/22/2018   LDLCALC 203 (H) 07/12/2018 1111   LDLDIRECT 112 (H) 01/10/2009 2318   Hepatic Function Panel      Component Value Date/Time   PROT 6.5 07/12/2018 1111   ALBUMIN 4.4 07/12/2018 1111   AST 22 08/22/2018   ALT 21 08/22/2018   ALKPHOS 44 08/22/2018   BILITOT 0.7 07/12/2018 1111   BILIDIR 0.6 (A) 08/22/2018      Component Value Date/Time   TSH 3.16 08/22/2018   TSH 3.380 07/12/2018 1111   TSH 1.360 11/28/2009 1458   TSH 2.957 09/17/2009 0126    Ref. Range 07/12/2018 11:11  Vitamin D, 25-Hydroxy Latest Ref Range: 30.0 - 100.0 ng/mL 48.8   OBESITY BEHAVIORAL INTERVENTION VISIT  Today's visit was #7  Starting weight: 195 lbs Starting date: 07/12/2018 Today's weight: 182 lbs  Today's date: 11/08/2018 Total lbs lost to date: 13 At least 15 minutes were spent on discussing the following behavioral intervention visit.   ASK: We discussed the diagnosis of obesity with Corena Pilgrim today and Amarya agreed to give [name] permission to discuss obesity behavioral modification therapy today.  ASSESS: Abel has the diagnosis of obesity and her BMI today is 32.24. Jaiyanna is in the action stage of change.   ADVISE: Latoy was educated on the multiple health risks of obesity as well as the benefit of weight loss to improve her health. She was advised of the need for long term treatment and the importance of lifestyle modifications to improve her current health and to decrease her risk of future health problems.  AGREE: Multiple dietary modification options and treatment options were discussed and  Tequilla agreed to follow the recommendations documented in the above note.  ARRANGE: Soleil was educated on the importance of frequent visits to treat obesity as outlined per CMS and USPSTF guidelines and agreed to schedule her next follow up  appointment today.  IMichaelene Song, am acting as Location manager for [employer], FNP-C.  I have reviewed the above documentation for accuracy and completeness, and I agree with the above.  - Majestic Molony, FNP-C.

## 2018-11-09 ENCOUNTER — Encounter (INDEPENDENT_AMBULATORY_CARE_PROVIDER_SITE_OTHER): Payer: Self-pay | Admitting: Family Medicine

## 2018-11-29 ENCOUNTER — Encounter (INDEPENDENT_AMBULATORY_CARE_PROVIDER_SITE_OTHER): Payer: Self-pay | Admitting: Family Medicine

## 2018-11-29 ENCOUNTER — Other Ambulatory Visit: Payer: Self-pay

## 2018-11-29 ENCOUNTER — Ambulatory Visit (INDEPENDENT_AMBULATORY_CARE_PROVIDER_SITE_OTHER): Payer: Medicare Other | Admitting: Family Medicine

## 2018-11-29 VITALS — BP 100/64 | HR 52 | Temp 97.7°F | Ht 63.0 in | Wt 181.0 lb

## 2018-11-29 DIAGNOSIS — F3289 Other specified depressive episodes: Secondary | ICD-10-CM | POA: Diagnosis not present

## 2018-11-29 DIAGNOSIS — I10 Essential (primary) hypertension: Secondary | ICD-10-CM | POA: Diagnosis not present

## 2018-11-29 DIAGNOSIS — E66811 Obesity, class 1: Secondary | ICD-10-CM

## 2018-11-29 DIAGNOSIS — Z6832 Body mass index (BMI) 32.0-32.9, adult: Secondary | ICD-10-CM

## 2018-11-29 DIAGNOSIS — E669 Obesity, unspecified: Secondary | ICD-10-CM

## 2018-11-29 NOTE — Progress Notes (Signed)
Office: [phone]  /  Fax: [phone]   HPI:   Chief Complaint: OBESITY Brianna Wu is here to discuss her progress with her obesity treatment plan. She is on the Category 1 plan + 100 grams of protein and is following her eating plan approximately 95-98 % of the time. She states she is walking 1 mile 3 times per week. Brianna Wu is cruising to the [place] next week.   Her weight is 181 lb (82.1 kg) today and has had a weight loss of 1 pounds over a period of 3 weeks since her last visit. She has lost 8 lbs since starting treatment with [name].  Hypertension Brianna Wu is a 73 y.o. female with hypertension.  Brianna Wu denies chest pain or shortness of breath on exertion. She is working weight loss to help control her blood pressure with the goal of decreasing her risk of heart attack and stroke. Brianna Wu blood pressure is low today. She has had some lightheadedness recently.   Depression with emotional eating behaviors Brianna Wu is struggling with emotional eating and using food for comfort to the extent that it is negatively impacting her health. She reports cravings after dinner. She drinks coffee when this happens. She often snacks when she is not hungry. Brianna Wu sometimes feels she is out of control and then feels guilty that she made poor food choices. She has been working on behavior modification techniques to help reduce her emotional eating and has been somewhat successful. She shows no sign of suicidal or homicidal ideations.  Depression screen PHQ 2/9 07/12/2018  Decreased Interest 3  Down, Depressed, Hopeless 1  PHQ - 2 Score 4  Altered sleeping 0  Tired, decreased energy 2  Change in appetite 0  Feeling bad or failure about yourself  0  Trouble concentrating 0  Moving slowly or fidgety/restless 0  Suicidal thoughts 0  PHQ-9 Score 6  Difficult doing work/chores Not difficult at all     ASSESSMENT AND PLAN:  Essential hypertension  Other depression - with emotional  eating  Class 1 obesity with serious comorbidity and body mass index (BMI) of 32.0 to 32.9 in adult, unspecified obesity type  PLAN:  Hypertension We discussed sodium restriction, working on healthy weight loss, and a regular exercise program as the means to achieve improved blood pressure control. Brianna Wu agreed with this plan and agreed to follow up as directed. We will continue to monitor her blood pressure as well as her progress with the above lifestyle modifications. She will continue her medications as prescribed and will watch for signs of hypotension as she continues her lifestyle modifications. We discussed discontinuing lisinopril but she would like to wait until after her cruise. I may consider discontinuing lisinopril if hypotension continues.Marland Kitchen Brianna Wu agrees to follow up with our clinic in 3 weeks.  Depression with Emotional Eating Behaviors We discussed behavior modification techniques today to help Brianna Wu deal with her emotional eating and depression. Emotional eating strategies were discussed.  Obesity Brianna Wu is currently in the action stage of change. As such, her goal is to continue with weight loss efforts. We discussed goal weight and decided on 160 lbs. Handout was given: Emotional vs Physical hunger She has agreed to follow the Category 1 plan + 100 calories Brianna Wu has been instructed to continue with current exercise regimen and add resistance 2-3 times a week. We discussed the following Behavioral Modification Strategies today: emotional eating strategies, travel eating strategies and planning for success  Brianna Wu has agreed to  follow up with our clinic in 3 weeks. She was informed of the importance of frequent follow up visits to maximize her success with intensive lifestyle modifications for her multiple health conditions.  ALLERGIES: Allergies  Allergen Reactions  . Honey Bee Treatment [Bee Venom]   . Oxycodone-Acetaminophen   . Percocet [Oxycodone-Acetaminophen]      MEDICATIONS: Current Outpatient Medications on File Prior to Visit  Medication Sig Dispense Refill  . cholecalciferol (VITAMIN D) 1000 UNITS tablet Take 1,000 Units by mouth 5 (five) times daily.    Marland Kitchen dicyclomine (BENTYL) 20 MG tablet Take 20 mg by mouth every 6 (six) hours.    . gabapentin (NEURONTIN) 300 MG capsule Take 300 mg by mouth 2 (two) times daily.    . hydrochlorothiazide (HYDRODIURIL) 25 MG tablet Take 25 mg by mouth daily.    Javier Docker Oil 500 MG CAPS Take 500 mg by mouth. Take two tabs at night    . levothyroxine (SYNTHROID, LEVOTHROID) 50 MCG tablet Take 50 mcg by mouth daily.     Marland Kitchen lisinopril (PRINIVIL,ZESTRIL) 20 MG tablet Take 20 mg by mouth daily. 1/2 tab daily    . magnesium gluconate (MAGONATE) 500 MG tablet Take 400 mg by mouth 4 (four) times daily.    . Multiple Vitamin (MULTIVITAMIN) tablet Take 1 tablet by mouth daily.    Marland Kitchen omeprazole (PRILOSEC) 40 MG capsule Take 40 mg by mouth daily.    . polyethylene glycol powder (GLYCOLAX/MIRALAX) powder Take 17 g by mouth daily. 3350 g 0  . propranolol (INDERAL) 20 MG tablet Take 20 mg by mouth 3 (three) times daily.    Marland Kitchen pyridOXINE (VITAMIN B-6) 100 MG tablet Take 100 mg by mouth daily.    . Rosuvastatin Calcium 40 MG CPSP Take 40 mg by mouth daily.     . vitamin C (ASCORBIC ACID) 500 MG tablet Take 500 mg by mouth daily.     No current facility-administered medications on file prior to visit.     PAST MEDICAL HISTORY: Past Medical History:  Diagnosis Date  . Aortic insufficiency   . Arthritis   . Cancer ([place])   . Constipation   . Depressed   . GERD (gastroesophageal reflux disease)   . Heart disease    vvalvular with dilated aortic root 4.2cm (echo 10-09)  . History of heart attack   . HLD (hyperlipidemia)   . HTN (hypertension)   . Hypothyroidism   . Pernicious anemia   . Renal cell carcinoma 2004   L stage I  . Scoliosis   . Seasonal asthma   . Stomach ulcer   . Upper abdominal pain     PAST SURGICAL  HISTORY: Past Surgical History:  Procedure Laterality Date  . arthroscopic knee     x2  . bilat cataract surgery     Dr. Kirby Funk  . CESAREAN SECTION    . CHOLECYSTECTOMY  1974  . HYSTERECTOMY ABDOMINAL WITH SALPINGECTOMY    . INGUINAL HERNIA REPAIR  1970  . l nephrectomy for stage RCC  2005  . ltcs     x2  . SKIN CANCER EXCISION    . stomach stapling    . TONSILLECTOMY  1950  . TOTAL ABDOMINAL HYSTERECTOMY  1976    SOCIAL HISTORY: Social History   Tobacco Use  . Smoking status: Never Smoker  . Smokeless tobacco: Never Used  Substance Use Topics  . Alcohol use: Not on file  . Drug use: Not on file    FAMILY  HISTORY: Family History  Problem Relation Age of Onset  . Breast cancer Mother   . Lung cancer Mother   . Depression Mother   . Hyperlipidemia Mother   . High blood pressure Mother   . Lung cancer Father   . Heart disease Father   . Lung cancer Brother     ROS: Review of Systems  Constitutional: Positive for weight loss.  Cardiovascular: Negative for chest pain.  Psychiatric/Behavioral: Positive for depression. Negative for suicidal ideas.       Negative for homicidal ideation    PHYSICAL EXAM: Blood pressure 100/64, pulse (!) 52, temperature 97.7 F (36.5 C), temperature source Oral, height 5\' 3"  (1.6 m), weight 181 lb (82.1 kg), SpO2 96 %. Body mass index is 32.06 kg/m. Physical Exam Vitals signs reviewed.  Constitutional:      Appearance: Normal appearance. She is obese.  Cardiovascular:     Rate and Rhythm: Normal rate.     Pulses: Normal pulses.  Pulmonary:     Effort: Pulmonary effort is normal.  Musculoskeletal: Normal range of motion.  Skin:    General: Skin is warm and dry.  Neurological:     Mental Status: She is alert and oriented to person, place, and time.  Psychiatric:        Mood and Affect: Mood normal.        Behavior: Behavior normal.     RECENT LABS AND TESTS: BMET    Component Value Date/Time   NA 145 08/22/2018    K 3.7 08/22/2018   CL 101 07/12/2018 1111   CO2 25 07/12/2018 1111   GLUCOSE 92 07/12/2018 1111   GLUCOSE 89 08/19/2010 1514   BUN 26 (A) 08/22/2018   CREATININE 1.3 (A) 08/22/2018   CREATININE 1.04 (H) 07/12/2018 1111   CALCIUM 9.1 07/12/2018 1111   GFRNONAA 54 (L) 07/12/2018 1111   GFRAA 62 07/12/2018 1111   Lab Results  Component Value Date   HGBA1C 5.3 07/12/2018   Lab Results  Component Value Date   INSULIN 5.8 07/12/2018   CBC    Component Value Date/Time   WBC 6.1 08/22/2018   WBC 6.4 09/17/2009 0126   RBC 4.68 09/17/2009 0126   HGB 12.8 08/22/2018   HCT 42 08/22/2018   PLT 199 08/22/2018   MCV 91.9 09/17/2009 0126   MCHC 32.6 09/17/2009 0126   RDW 13.3 09/17/2009 0126   LYMPHSABS 2.1 09/17/2009 0126   MONOABS 0.5 09/17/2009 0126   EOSABS 0.2 09/17/2009 0126   BASOSABS 0.1 09/17/2009 0126   Iron/TIBC/Ferritin/ %Sat    Component Value Date/Time   IRON 86 09/17/2009 0126   TIBC 320 09/17/2009 0126   FERRITIN 28 03/27/2008 2053   IRONPCTSAT 27 09/17/2009 0126   Lipid Panel     Component Value Date/Time   CHOL 126 08/22/2018   CHOL 283 (H) 07/12/2018 1111   TRIG 104 08/22/2018   HDL 40 08/22/2018   HDL 50 07/12/2018 1111   CHOLHDL 2.7 Ratio 04/11/2009 2004   VLDL 13 04/11/2009 2004   LDLCALC 65 08/22/2018   LDLCALC 203 (H) 07/12/2018 1111   LDLDIRECT 112 (H) 01/10/2009 2318   Hepatic Function Panel     Component Value Date/Time   PROT 6.5 07/12/2018 1111   ALBUMIN 4.4 07/12/2018 1111   AST 22 08/22/2018   ALT 21 08/22/2018   ALKPHOS 44 08/22/2018   BILITOT 0.7 07/12/2018 1111   BILIDIR 0.6 (A) 08/22/2018      Component Value Date/Time  TSH 3.16 08/22/2018   TSH 3.380 07/12/2018 1111   TSH 1.360 11/28/2009 1458   TSH 2.957 09/17/2009 0126      OBESITY BEHAVIORAL INTERVENTION VISIT  Today's visit was # 8   Starting weight: 189 lbs Starting date: 07/12/2018 Today's weight : Weight: 181 lb (82.1 kg)  Today's date: 11/29/2018 Total  lbs lost to date: 8  At least 15 minutes were spent on discussing the following behavioral intervention visit.     11/29/2018  BP 100/64  Temp 97.7 F (36.5 C)  Pulse 52 (A)  SpO2 96 %  Weight 181 lb  Height 5\' 3"  (1.6 m)  BMI (Calculated) 32.07   ASK: We discussed the diagnosis of obesity with Corena Pilgrim today and Corry agreed to give [name] permission to discuss obesity behavioral modification therapy today.  ASSESS: Brianna Wu has the diagnosis of obesity and her BMI today is 32.07 Jarely is in the action stage of change   ADVISE: Christyl was educated on the multiple health risks of obesity as well as the benefit of weight loss to improve her health. She was advised of the need for long term treatment and the importance of lifestyle modifications to improve her current health and to decrease her risk of future health problems.  AGREE: Multiple dietary modification options and treatment options were discussed and  Kobe agreed to follow the recommendations documented in the above note.  ARRANGE: Jarita was educated on the importance of frequent visits to treat obesity as outlined per CMS and USPSTF guidelines and agreed to schedule her next follow up appointment today.  I, Tammy Wysor, am acting as Location manager for [employer], FNP-C.  I have reviewed the above documentation for accuracy and completeness, and I agree with the above.  - Suhaila Troiano, FNP-C.

## 2018-12-13 ENCOUNTER — Encounter (INDEPENDENT_AMBULATORY_CARE_PROVIDER_SITE_OTHER): Payer: Self-pay

## 2018-12-18 ENCOUNTER — Ambulatory Visit (INDEPENDENT_AMBULATORY_CARE_PROVIDER_SITE_OTHER): Payer: Medicare Other | Admitting: Family Medicine

## 2019-10-20 DIAGNOSIS — M79672 Pain in left foot: Secondary | ICD-10-CM | POA: Diagnosis not present

## 2020-01-23 DIAGNOSIS — Z03818 Encounter for observation for suspected exposure to other biological agents ruled out: Secondary | ICD-10-CM | POA: Diagnosis not present

## 2020-01-23 DIAGNOSIS — Z20828 Contact with and (suspected) exposure to other viral communicable diseases: Secondary | ICD-10-CM | POA: Diagnosis not present

## 2020-07-25 DIAGNOSIS — E039 Hypothyroidism, unspecified: Secondary | ICD-10-CM | POA: Diagnosis not present

## 2020-07-25 DIAGNOSIS — I10 Essential (primary) hypertension: Secondary | ICD-10-CM | POA: Diagnosis not present

## 2020-07-25 DIAGNOSIS — I249 Acute ischemic heart disease, unspecified: Secondary | ICD-10-CM | POA: Diagnosis not present

## 2020-07-25 DIAGNOSIS — K769 Liver disease, unspecified: Secondary | ICD-10-CM | POA: Diagnosis not present

## 2020-07-25 DIAGNOSIS — E785 Hyperlipidemia, unspecified: Secondary | ICD-10-CM | POA: Diagnosis not present

## 2020-07-25 DIAGNOSIS — R251 Tremor, unspecified: Secondary | ICD-10-CM | POA: Diagnosis not present

## 2022-08-10 ENCOUNTER — Encounter (HOSPITAL_COMMUNITY): Payer: Self-pay | Admitting: Cardiovascular Disease

## 2022-08-24 ENCOUNTER — Encounter (HOSPITAL_BASED_OUTPATIENT_CLINIC_OR_DEPARTMENT_OTHER): Payer: Self-pay | Admitting: Cardiovascular Disease

## 2022-09-02 ENCOUNTER — Other Ambulatory Visit (HOSPITAL_COMMUNITY): Payer: Self-pay | Admitting: Cardiovascular Disease

## 2022-09-02 DIAGNOSIS — R079 Chest pain, unspecified: Secondary | ICD-10-CM

## 2022-09-02 DIAGNOSIS — R9431 Abnormal electrocardiogram [ECG] [EKG]: Secondary | ICD-10-CM

## 2022-09-09 ENCOUNTER — Telehealth (HOSPITAL_COMMUNITY): Payer: Self-pay | Admitting: *Deleted

## 2022-09-09 NOTE — Telephone Encounter (Signed)
Reaching out to patient to offer assistance regarding upcoming cardiac imaging study; pt verbalizes understanding of appt date/time, parking situation and where to check in, pre-test NPO status and verified current allergies; name and call back number provided for further questions should they arise  Gordy Clement RN Navigator Card[facility] [phone] office [phone] cell  Patient to arrive at 11:30am.

## 2022-09-10 ENCOUNTER — Ambulatory Visit (HOSPITAL_COMMUNITY)
Admission: RE | Admit: 2022-09-10 | Discharge: 2022-09-10 | Disposition: A | Discharge status: Home / Self Care | Payer: No Typology Code available for payment source | Source: Ambulatory Visit | Attending: Cardiovascular Disease | Admitting: Cardiovascular Disease

## 2022-09-10 DIAGNOSIS — R079 Chest pain, unspecified: Secondary | ICD-10-CM | POA: Diagnosis not present

## 2022-09-10 DIAGNOSIS — R9431 Abnormal electrocardiogram [ECG] [EKG]: Secondary | ICD-10-CM | POA: Diagnosis not present

## 2022-09-10 MED ORDER — NITROGLYCERIN 0.4 MG SL SUBL
SUBLINGUAL_TABLET | SUBLINGUAL | Status: AC
  Filled 2022-09-10: qty 2

## 2022-09-10 MED ORDER — NITROGLYCERIN 0.4 MG SL SUBL
0.8000 mg | SUBLINGUAL_TABLET | Freq: Once | SUBLINGUAL | Status: AC
  Administered 2022-09-10: 0.8 mg via SUBLINGUAL

## 2022-09-10 MED ORDER — IOHEXOL 350 MG/ML SOLN
95.0000 mL | Freq: Once | INTRAVENOUS | Status: AC | PRN
  Administered 2022-09-10: 95 mL via INTRAVENOUS
